# Patient Record
Sex: Male | Born: 1960 | Race: White | Hispanic: No | Marital: Married | Smoking: Never smoker
Health system: Southern US, Community
[De-identification: ages and names within clinical notes are randomized; demographics above are authoritative.]

## PROBLEM LIST (undated history)

## (undated) DIAGNOSIS — F411 Generalized anxiety disorder: Secondary | ICD-10-CM

## (undated) DIAGNOSIS — G4733 Obstructive sleep apnea (adult) (pediatric): Secondary | ICD-10-CM

## (undated) DIAGNOSIS — E669 Obesity, unspecified: Secondary | ICD-10-CM

## (undated) DIAGNOSIS — S0300XA Dislocation of jaw, unspecified side, initial encounter: Secondary | ICD-10-CM

## (undated) DIAGNOSIS — Z9989 Dependence on other enabling machines and devices: Secondary | ICD-10-CM

## (undated) DIAGNOSIS — E66811 Obesity, class 1: Secondary | ICD-10-CM

## (undated) DIAGNOSIS — E039 Hypothyroidism, unspecified: Secondary | ICD-10-CM

## (undated) HISTORY — DX: Hypothyroidism, unspecified: E03.9

## (undated) HISTORY — DX: Obesity, class 1: E66.811

## (undated) HISTORY — DX: Generalized anxiety disorder: F41.1

## (undated) HISTORY — DX: Obesity, unspecified: E66.9

## (undated) HISTORY — DX: Obstructive sleep apnea (adult) (pediatric): G47.33

## (undated) HISTORY — PX: EYE MUSCLE SURGERY: SHX370

## (undated) HISTORY — DX: Dislocation of jaw, unspecified side, initial encounter: S03.00XA

## (undated) HISTORY — DX: Dependence on other enabling machines and devices: Z99.89

---

## 1999-11-30 ENCOUNTER — Ambulatory Visit (HOSPITAL_COMMUNITY): Admission: RE | Admit: 1999-11-30 | Discharge: 1999-11-30 | Payer: Self-pay | Admitting: Urology

## 2001-02-03 ENCOUNTER — Encounter: Payer: Self-pay | Admitting: Emergency Medicine

## 2001-02-03 ENCOUNTER — Emergency Department (HOSPITAL_COMMUNITY): Admission: EM | Admit: 2001-02-03 | Discharge: 2001-02-03 | Payer: Self-pay | Admitting: Emergency Medicine

## 2004-05-03 ENCOUNTER — Ambulatory Visit: Payer: Self-pay | Admitting: Family Medicine

## 2004-05-08 ENCOUNTER — Ambulatory Visit: Payer: Self-pay | Admitting: Family Medicine

## 2005-05-02 ENCOUNTER — Ambulatory Visit: Payer: Self-pay | Admitting: Family Medicine

## 2005-05-07 ENCOUNTER — Ambulatory Visit: Payer: Self-pay | Admitting: Family Medicine

## 2005-09-18 ENCOUNTER — Ambulatory Visit: Payer: Self-pay | Admitting: Family Medicine

## 2005-10-25 ENCOUNTER — Ambulatory Visit: Payer: Self-pay | Admitting: Gastroenterology

## 2005-11-15 ENCOUNTER — Ambulatory Visit: Payer: Self-pay | Admitting: Gastroenterology

## 2005-11-18 ENCOUNTER — Ambulatory Visit: Payer: Self-pay | Admitting: Family Medicine

## 2006-03-21 ENCOUNTER — Ambulatory Visit: Payer: Self-pay | Admitting: Family Medicine

## 2010-12-05 ENCOUNTER — Encounter: Payer: Self-pay | Admitting: Gastroenterology

## 2012-05-15 DIAGNOSIS — S42309S Unspecified fracture of shaft of humerus, unspecified arm, sequela: Secondary | ICD-10-CM

## 2012-05-15 HISTORY — DX: Unspecified fracture of shaft of humerus, unspecified arm, sequela: S42.309S

## 2015-09-21 DIAGNOSIS — L820 Inflamed seborrheic keratosis: Secondary | ICD-10-CM | POA: Diagnosis not present

## 2015-09-21 DIAGNOSIS — D1801 Hemangioma of skin and subcutaneous tissue: Secondary | ICD-10-CM | POA: Diagnosis not present

## 2015-09-21 DIAGNOSIS — L814 Other melanin hyperpigmentation: Secondary | ICD-10-CM | POA: Diagnosis not present

## 2015-09-21 DIAGNOSIS — L821 Other seborrheic keratosis: Secondary | ICD-10-CM | POA: Diagnosis not present

## 2015-09-21 DIAGNOSIS — D225 Melanocytic nevi of trunk: Secondary | ICD-10-CM | POA: Diagnosis not present

## 2015-11-07 DIAGNOSIS — E039 Hypothyroidism, unspecified: Secondary | ICD-10-CM | POA: Diagnosis not present

## 2015-11-07 DIAGNOSIS — Z0000 Encounter for general adult medical examination without abnormal findings: Secondary | ICD-10-CM | POA: Diagnosis not present

## 2015-11-07 DIAGNOSIS — Z125 Encounter for screening for malignant neoplasm of prostate: Secondary | ICD-10-CM | POA: Diagnosis not present

## 2015-11-24 DIAGNOSIS — Z0000 Encounter for general adult medical examination without abnormal findings: Secondary | ICD-10-CM | POA: Diagnosis not present

## 2015-11-24 DIAGNOSIS — Z23 Encounter for immunization: Secondary | ICD-10-CM | POA: Diagnosis not present

## 2015-12-15 DIAGNOSIS — G4733 Obstructive sleep apnea (adult) (pediatric): Secondary | ICD-10-CM | POA: Diagnosis not present

## 2016-01-05 DIAGNOSIS — E039 Hypothyroidism, unspecified: Secondary | ICD-10-CM | POA: Diagnosis not present

## 2016-03-05 DIAGNOSIS — F411 Generalized anxiety disorder: Secondary | ICD-10-CM | POA: Diagnosis not present

## 2016-03-05 DIAGNOSIS — E039 Hypothyroidism, unspecified: Secondary | ICD-10-CM | POA: Diagnosis not present

## 2016-03-29 DIAGNOSIS — D3131 Benign neoplasm of right choroid: Secondary | ICD-10-CM | POA: Diagnosis not present

## 2016-03-29 DIAGNOSIS — H01001 Unspecified blepharitis right upper eyelid: Secondary | ICD-10-CM | POA: Diagnosis not present

## 2016-03-29 DIAGNOSIS — H524 Presbyopia: Secondary | ICD-10-CM | POA: Diagnosis not present

## 2016-03-29 DIAGNOSIS — H53001 Unspecified amblyopia, right eye: Secondary | ICD-10-CM | POA: Diagnosis not present

## 2016-05-07 DIAGNOSIS — F439 Reaction to severe stress, unspecified: Secondary | ICD-10-CM | POA: Diagnosis not present

## 2016-06-13 DIAGNOSIS — G4733 Obstructive sleep apnea (adult) (pediatric): Secondary | ICD-10-CM | POA: Diagnosis not present

## 2016-11-04 DIAGNOSIS — K635 Polyp of colon: Secondary | ICD-10-CM | POA: Diagnosis not present

## 2016-11-04 DIAGNOSIS — Z86010 Personal history of colonic polyps: Secondary | ICD-10-CM | POA: Diagnosis not present

## 2016-11-04 DIAGNOSIS — K648 Other hemorrhoids: Secondary | ICD-10-CM | POA: Diagnosis not present

## 2016-11-04 DIAGNOSIS — Z1211 Encounter for screening for malignant neoplasm of colon: Secondary | ICD-10-CM | POA: Diagnosis not present

## 2016-11-04 DIAGNOSIS — D125 Benign neoplasm of sigmoid colon: Secondary | ICD-10-CM | POA: Diagnosis not present

## 2016-11-04 DIAGNOSIS — Z800 Family history of malignant neoplasm of digestive organs: Secondary | ICD-10-CM | POA: Diagnosis not present

## 2016-11-04 DIAGNOSIS — D122 Benign neoplasm of ascending colon: Secondary | ICD-10-CM | POA: Diagnosis not present

## 2016-11-05 DIAGNOSIS — D225 Melanocytic nevi of trunk: Secondary | ICD-10-CM | POA: Diagnosis not present

## 2016-11-05 DIAGNOSIS — L821 Other seborrheic keratosis: Secondary | ICD-10-CM | POA: Diagnosis not present

## 2016-11-05 DIAGNOSIS — L814 Other melanin hyperpigmentation: Secondary | ICD-10-CM | POA: Diagnosis not present

## 2016-11-05 DIAGNOSIS — D2339 Other benign neoplasm of skin of other parts of face: Secondary | ICD-10-CM | POA: Diagnosis not present

## 2016-11-06 DIAGNOSIS — D125 Benign neoplasm of sigmoid colon: Secondary | ICD-10-CM | POA: Diagnosis not present

## 2016-11-06 DIAGNOSIS — D122 Benign neoplasm of ascending colon: Secondary | ICD-10-CM | POA: Diagnosis not present

## 2016-11-20 DIAGNOSIS — R5381 Other malaise: Secondary | ICD-10-CM | POA: Diagnosis not present

## 2016-11-20 DIAGNOSIS — S81019A Laceration without foreign body, unspecified knee, initial encounter: Secondary | ICD-10-CM | POA: Diagnosis not present

## 2016-11-20 DIAGNOSIS — R0789 Other chest pain: Secondary | ICD-10-CM | POA: Diagnosis not present

## 2016-12-02 DIAGNOSIS — Z0000 Encounter for general adult medical examination without abnormal findings: Secondary | ICD-10-CM | POA: Diagnosis not present

## 2016-12-02 DIAGNOSIS — Z125 Encounter for screening for malignant neoplasm of prostate: Secondary | ICD-10-CM | POA: Diagnosis not present

## 2016-12-05 DIAGNOSIS — E7800 Pure hypercholesterolemia, unspecified: Secondary | ICD-10-CM | POA: Diagnosis not present

## 2016-12-05 DIAGNOSIS — Z0001 Encounter for general adult medical examination with abnormal findings: Secondary | ICD-10-CM | POA: Diagnosis not present

## 2016-12-05 DIAGNOSIS — Z8781 Personal history of (healed) traumatic fracture: Secondary | ICD-10-CM | POA: Diagnosis not present

## 2016-12-05 DIAGNOSIS — E039 Hypothyroidism, unspecified: Secondary | ICD-10-CM | POA: Diagnosis not present

## 2017-01-21 DIAGNOSIS — R972 Elevated prostate specific antigen [PSA]: Secondary | ICD-10-CM | POA: Diagnosis not present

## 2017-03-24 DIAGNOSIS — G4733 Obstructive sleep apnea (adult) (pediatric): Secondary | ICD-10-CM | POA: Diagnosis not present

## 2017-06-25 DIAGNOSIS — G4733 Obstructive sleep apnea (adult) (pediatric): Secondary | ICD-10-CM | POA: Diagnosis not present

## 2017-10-30 DIAGNOSIS — G4733 Obstructive sleep apnea (adult) (pediatric): Secondary | ICD-10-CM | POA: Diagnosis not present

## 2017-12-02 DIAGNOSIS — D229 Melanocytic nevi, unspecified: Secondary | ICD-10-CM | POA: Diagnosis not present

## 2017-12-02 DIAGNOSIS — L578 Other skin changes due to chronic exposure to nonionizing radiation: Secondary | ICD-10-CM | POA: Diagnosis not present

## 2017-12-02 DIAGNOSIS — L821 Other seborrheic keratosis: Secondary | ICD-10-CM | POA: Diagnosis not present

## 2017-12-02 DIAGNOSIS — L814 Other melanin hyperpigmentation: Secondary | ICD-10-CM | POA: Diagnosis not present

## 2017-12-12 DIAGNOSIS — Z0000 Encounter for general adult medical examination without abnormal findings: Secondary | ICD-10-CM | POA: Diagnosis not present

## 2017-12-12 DIAGNOSIS — Z125 Encounter for screening for malignant neoplasm of prostate: Secondary | ICD-10-CM | POA: Diagnosis not present

## 2017-12-12 DIAGNOSIS — E039 Hypothyroidism, unspecified: Secondary | ICD-10-CM | POA: Diagnosis not present

## 2017-12-12 DIAGNOSIS — E7800 Pure hypercholesterolemia, unspecified: Secondary | ICD-10-CM | POA: Diagnosis not present

## 2017-12-15 DIAGNOSIS — Z23 Encounter for immunization: Secondary | ICD-10-CM | POA: Diagnosis not present

## 2017-12-15 DIAGNOSIS — Z0000 Encounter for general adult medical examination without abnormal findings: Secondary | ICD-10-CM | POA: Diagnosis not present

## 2017-12-15 DIAGNOSIS — E7800 Pure hypercholesterolemia, unspecified: Secondary | ICD-10-CM | POA: Diagnosis not present

## 2017-12-15 DIAGNOSIS — Z1212 Encounter for screening for malignant neoplasm of rectum: Secondary | ICD-10-CM | POA: Diagnosis not present

## 2018-02-13 DIAGNOSIS — E039 Hypothyroidism, unspecified: Secondary | ICD-10-CM | POA: Diagnosis not present

## 2018-02-27 DIAGNOSIS — Z23 Encounter for immunization: Secondary | ICD-10-CM | POA: Diagnosis not present

## 2018-04-03 DIAGNOSIS — G4733 Obstructive sleep apnea (adult) (pediatric): Secondary | ICD-10-CM | POA: Diagnosis not present

## 2018-04-17 DIAGNOSIS — D485 Neoplasm of uncertain behavior of skin: Secondary | ICD-10-CM | POA: Diagnosis not present

## 2018-04-24 DIAGNOSIS — H04123 Dry eye syndrome of bilateral lacrimal glands: Secondary | ICD-10-CM | POA: Diagnosis not present

## 2018-04-24 DIAGNOSIS — H524 Presbyopia: Secondary | ICD-10-CM | POA: Diagnosis not present

## 2018-04-24 DIAGNOSIS — H53001 Unspecified amblyopia, right eye: Secondary | ICD-10-CM | POA: Diagnosis not present

## 2018-04-24 DIAGNOSIS — D3131 Benign neoplasm of right choroid: Secondary | ICD-10-CM | POA: Diagnosis not present

## 2018-05-08 DIAGNOSIS — D485 Neoplasm of uncertain behavior of skin: Secondary | ICD-10-CM | POA: Diagnosis not present

## 2018-05-27 ENCOUNTER — Encounter: Payer: Self-pay | Admitting: Podiatry

## 2018-05-27 ENCOUNTER — Ambulatory Visit (INDEPENDENT_AMBULATORY_CARE_PROVIDER_SITE_OTHER): Payer: BLUE CROSS/BLUE SHIELD

## 2018-05-27 ENCOUNTER — Ambulatory Visit (INDEPENDENT_AMBULATORY_CARE_PROVIDER_SITE_OTHER): Payer: BLUE CROSS/BLUE SHIELD | Admitting: Podiatry

## 2018-05-27 ENCOUNTER — Other Ambulatory Visit: Payer: Self-pay | Admitting: Podiatry

## 2018-05-27 VITALS — BP 139/77

## 2018-05-27 DIAGNOSIS — M79672 Pain in left foot: Secondary | ICD-10-CM

## 2018-05-27 DIAGNOSIS — M722 Plantar fascial fibromatosis: Secondary | ICD-10-CM

## 2018-05-27 MED ORDER — TRIAMCINOLONE ACETONIDE 10 MG/ML IJ SUSP
10.0000 mg | Freq: Once | INTRAMUSCULAR | Status: AC
  Administered 2018-05-27: 10 mg

## 2018-05-27 NOTE — Patient Instructions (Signed)
Plantar Fasciitis      Plantar fasciitis is a painful foot condition that affects the heel. It occurs when the band of tissue that connects the toes to the heel bone (plantar fascia) becomes irritated. This can happen as the result of exercising too much or doing other repetitive activities (overuse injury).  The pain from plantar fasciitis can range from mild irritation to severe pain that makes it difficult to walk or move. The pain is usually worse in the morning after sleeping, or after sitting or lying down for a while. Pain may also be worse after long periods of walking or standing.  What are the causes?  This condition may be caused by:  Standing for long periods of time.  Wearing shoes that do not have good arch support.  Doing activities that put stress on joints (high-impact activities), including running, aerobics, and ballet.  Being overweight.  An abnormal way of walking (gait).  Tight muscles in the back of your lower leg (calf).  High arches in your feet.  Starting a new athletic activity.  What are the signs or symptoms?  The main symptom of this condition is heel pain. Pain may:  Be worse with first steps after a time of rest, especially in the morning after sleeping or after you have been sitting or lying down for a while.  Be worse after long periods of standing still.  Decrease after 30-45 minutes of activity, such as gentle walking.  How is this diagnosed?  This condition may be diagnosed based on your medical history and your symptoms. Your health care provider may ask questions about your activity level. Your health care provider will do a physical exam to check for:  A tender area on the bottom of your foot.  A high arch in your foot.  Pain when you move your foot.  Difficulty moving your foot.  You may have imaging tests to confirm the diagnosis, such as:  X-rays.  Ultrasound.  MRI.  How is this treated?  Treatment for plantar fasciitis depends on how severe your condition is.  Treatment may include:  Rest, ice, applying pressure (compression), and raising the affected foot (elevation). This may be called RICE therapy. Your health care provider may recommend RICE therapy along with over-the-counter pain medicines to manage your pain.  Exercises to stretch your calves and your plantar fascia.  A splint that holds your foot in a stretched, upward position while you sleep (night splint).  Physical therapy to relieve symptoms and prevent problems in the future.  Injections of steroid medicine (cortisone) to relieve pain and inflammation.  Stimulating your plantar fascia with electrical impulses (extracorporeal shock wave therapy). This is usually the last treatment option before surgery.  Surgery, if other treatments have not worked after 12 months.  Follow these instructions at home:      Managing pain, stiffness, and swelling  If directed, put ice on the painful area:  Put ice in a plastic bag, or use a frozen bottle of water.  Place a towel between your skin and the bag or bottle.  Roll the bottom of your foot over the bag or bottle.  Do this for 20 minutes, 2-3 times a day.  Wear athletic shoes that have air-sole or gel-sole cushions, or try wearing soft shoe inserts that are designed for plantar fasciitis.  Raise (elevate) your foot above the level of your heart while you are sitting or lying down.  Activity  Avoid activities that cause pain.   Ask your health care provider what activities are safe for you.  Do physical therapy exercises and stretches as told by your health care provider.  Try activities and forms of exercise that are easier on your joints (low-impact). Examples include swimming, water aerobics, and biking.  General instructions  Take over-the-counter and prescription medicines only as told by your health care provider.  Wear a night splint while sleeping, if told by your health care provider. Loosen the splint if your toes tingle, become numb, or turn cold and  blue.  Maintain a healthy weight, or work with your health care provider to lose weight as needed.  Keep all follow-up visits as told by your health care provider. This is important.  Contact a health care provider if you:  Have symptoms that do not go away after caring for yourself at home.  Have pain that gets worse.  Have pain that affects your ability to move or do your daily activities.  Summary  Plantar fasciitis is a painful foot condition that affects the heel. It occurs when the band of tissue that connects the toes to the heel bone (plantar fascia) becomes irritated.  The main symptom of this condition is heel pain that may be worse after exercising too much or standing still for a long time.  Treatment varies, but it usually starts with rest, ice, compression, and elevation (RICE therapy) and over-the-counter medicines to manage pain.  This information is not intended to replace advice given to you by your health care provider. Make sure you discuss any questions you have with your health care provider.  Document Released: 12/04/2000 Document Revised: 01/06/2017 Document Reviewed: 01/06/2017  Elsevier Interactive Patient Education © 2019 [employer]

## 2018-05-27 NOTE — Progress Notes (Signed)
Subjective:   Patient ID: Jay Patton, male   DOB: 58 y.o.   MRN: [id number]   HPI Patient states the heel has been sore for the last few weeks and he was wearing different shoes starting November December and is actually been bothering him some since then.  Patient does not smoke and likes to be active   Review of Systems  All other systems reviewed and are negative.       Objective:  Physical Exam Vitals signs and nursing note reviewed.  Constitutional:      Appearance: He is well-developed.  Pulmonary:     Effort: Pulmonary effort is normal.  Musculoskeletal: Normal range of motion.  Skin:    General: Skin is warm.  Neurological:     Mental Status: He is alert.     Neurovascular status intact muscle strength is adequate range of motion within normal limits with patient found to have an area of exquisite discomfort plantar aspect left heel and is also noted to have a high arch foot structure.  Patient is noted to have good digital perfusion is well oriented x3     Assessment:  Cavus foot structure left with inflammation of the medial band of the fascia occurring     Plan:  H&P condition reviewed and I went ahead today and I injected the left plantar fascial 3 mg Kenalog 5 mg Xylocaine and advised on physical therapy supportive shoes not going barefoot and that it may require other treatment depending on symptoms  X-ray indicates a high arch foot structure with small spur formation with no indication of stress fracture arthritis

## 2018-07-27 DIAGNOSIS — G4733 Obstructive sleep apnea (adult) (pediatric): Secondary | ICD-10-CM | POA: Diagnosis not present

## 2018-11-09 DIAGNOSIS — G4733 Obstructive sleep apnea (adult) (pediatric): Secondary | ICD-10-CM | POA: Diagnosis not present

## 2018-12-17 DIAGNOSIS — Z0000 Encounter for general adult medical examination without abnormal findings: Secondary | ICD-10-CM | POA: Diagnosis not present

## 2018-12-30 DIAGNOSIS — E7800 Pure hypercholesterolemia, unspecified: Secondary | ICD-10-CM | POA: Diagnosis not present

## 2018-12-30 DIAGNOSIS — Z9989 Dependence on other enabling machines and devices: Secondary | ICD-10-CM | POA: Diagnosis not present

## 2018-12-30 DIAGNOSIS — E039 Hypothyroidism, unspecified: Secondary | ICD-10-CM | POA: Diagnosis not present

## 2018-12-30 DIAGNOSIS — Z23 Encounter for immunization: Secondary | ICD-10-CM | POA: Diagnosis not present

## 2018-12-30 DIAGNOSIS — Z0000 Encounter for general adult medical examination without abnormal findings: Secondary | ICD-10-CM | POA: Diagnosis not present

## 2019-02-12 DIAGNOSIS — E039 Hypothyroidism, unspecified: Secondary | ICD-10-CM | POA: Diagnosis not present

## 2019-02-20 DIAGNOSIS — S0502XA Injury of conjunctiva and corneal abrasion without foreign body, left eye, initial encounter: Secondary | ICD-10-CM | POA: Diagnosis not present

## 2019-03-05 DIAGNOSIS — S81011A Laceration without foreign body, right knee, initial encounter: Secondary | ICD-10-CM | POA: Diagnosis not present

## 2019-03-24 DIAGNOSIS — G4733 Obstructive sleep apnea (adult) (pediatric): Secondary | ICD-10-CM | POA: Diagnosis not present

## 2019-04-05 DIAGNOSIS — Z03818 Encounter for observation for suspected exposure to other biological agents ruled out: Secondary | ICD-10-CM | POA: Diagnosis not present

## 2019-04-05 DIAGNOSIS — Z20828 Contact with and (suspected) exposure to other viral communicable diseases: Secondary | ICD-10-CM | POA: Diagnosis not present

## 2019-07-14 DIAGNOSIS — E7800 Pure hypercholesterolemia, unspecified: Secondary | ICD-10-CM | POA: Diagnosis not present

## 2019-07-14 DIAGNOSIS — Z125 Encounter for screening for malignant neoplasm of prostate: Secondary | ICD-10-CM | POA: Diagnosis not present

## 2019-07-14 DIAGNOSIS — E039 Hypothyroidism, unspecified: Secondary | ICD-10-CM | POA: Diagnosis not present

## 2019-07-20 DIAGNOSIS — G4733 Obstructive sleep apnea (adult) (pediatric): Secondary | ICD-10-CM | POA: Diagnosis not present

## 2019-10-07 DIAGNOSIS — D2272 Melanocytic nevi of left lower limb, including hip: Secondary | ICD-10-CM | POA: Diagnosis not present

## 2019-10-07 DIAGNOSIS — D225 Melanocytic nevi of trunk: Secondary | ICD-10-CM | POA: Diagnosis not present

## 2019-10-07 DIAGNOSIS — L814 Other melanin hyperpigmentation: Secondary | ICD-10-CM | POA: Diagnosis not present

## 2019-10-07 DIAGNOSIS — D485 Neoplasm of uncertain behavior of skin: Secondary | ICD-10-CM | POA: Diagnosis not present

## 2019-10-07 DIAGNOSIS — L308 Other specified dermatitis: Secondary | ICD-10-CM | POA: Diagnosis not present

## 2019-10-21 DIAGNOSIS — G4733 Obstructive sleep apnea (adult) (pediatric): Secondary | ICD-10-CM | POA: Diagnosis not present

## 2019-11-29 DIAGNOSIS — H2000 Unspecified acute and subacute iridocyclitis: Secondary | ICD-10-CM | POA: Diagnosis not present

## 2019-11-29 DIAGNOSIS — S0502XA Injury of conjunctiva and corneal abrasion without foreign body, left eye, initial encounter: Secondary | ICD-10-CM | POA: Diagnosis not present

## 2019-12-01 DIAGNOSIS — S0502XD Injury of conjunctiva and corneal abrasion without foreign body, left eye, subsequent encounter: Secondary | ICD-10-CM | POA: Diagnosis not present

## 2019-12-20 ENCOUNTER — Emergency Department (HOSPITAL_COMMUNITY)
Admission: EM | Admit: 2019-12-20 | Discharge: 2019-12-20 | Disposition: A | Discharge status: Home / Self Care | Payer: BC Managed Care – PPO | Attending: Emergency Medicine | Admitting: Emergency Medicine

## 2019-12-20 ENCOUNTER — Emergency Department (HOSPITAL_COMMUNITY): Payer: BC Managed Care – PPO

## 2019-12-20 DIAGNOSIS — R079 Chest pain, unspecified: Secondary | ICD-10-CM | POA: Diagnosis not present

## 2019-12-20 DIAGNOSIS — R0789 Other chest pain: Secondary | ICD-10-CM | POA: Diagnosis not present

## 2019-12-20 LAB — BASIC METABOLIC PANEL
Anion gap: 10 (ref 5–15)
BUN: 13 mg/dL (ref 6–20)
CO2: 27 mmol/L (ref 22–32)
Calcium: 9.4 mg/dL (ref 8.9–10.3)
Chloride: 101 mmol/L (ref 98–111)
Creatinine, Ser: 0.96 mg/dL (ref 0.61–1.24)
GFR calc Af Amer: >60 mL/min (ref >60)
GFR calc non Af Amer: >60 mL/min (ref >60)
Glucose, Bld: 108 mg/dL — ABNORMAL HIGH (ref 70–99)
Potassium: 4 mmol/L (ref 3.5–5.1)
Sodium: 138 mmol/L (ref 135–145)

## 2019-12-20 LAB — CBC
HCT: 42.5 % (ref 39.0–52.0)
Hemoglobin: 13.8 g/dL (ref 13.0–17.0)
MCH: 29.7 pg (ref 26.0–34.0)
MCHC: 32.5 g/dL (ref 30.0–36.0)
MCV: 91.4 fL (ref 80.0–100.0)
Platelets: 215 10*3/uL (ref 150–400)
RBC: 4.65 MIL/uL (ref 4.22–5.81)
RDW: 12.9 % (ref 11.5–15.5)
WBC: 6.5 10*3/uL (ref 4.0–10.5)
nRBC: 0 % (ref 0.0–0.2)

## 2019-12-20 LAB — TROPONIN I (HIGH SENSITIVITY)
Troponin I (High Sensitivity): 3 ng/L (ref <18)
Troponin I (High Sensitivity): 4 ng/L (ref <18)

## 2019-12-20 NOTE — Discharge Instructions (Signed)
Follow-up with cardiology or your primary care doctor to have a stress test arranged/further cardiac work-up.  If pain comes back as discussed or symptoms worsen please return

## 2019-12-20 NOTE — ED Triage Notes (Signed)
Pt here from home with c/o right side chest pain , pain haas been on the left side for 2 weeks prior to today , no n/v or sob

## 2019-12-20 NOTE — ED Provider Notes (Addendum)
[facility] EMERGENCY DEPARTMENT Provider Note   CSN: [id number] Arrival date & time: 12/20/19  0957     History Chief Complaint  Patient presents with  . Chest Pain    Jay Patton is a 59 y.o. male.  The history is provided by the patient.  Chest Pain Pain location:  R chest Pain quality: sharp   Pain radiates to:  Does not radiate Pain severity:  Mild Onset quality:  Sudden Duration:  45 minutes Progression:  Resolved Chronicity:  New Context: at rest   Relieved by:  Nothing Worsened by:  Nothing Associated symptoms: no abdominal pain, no back pain, no cough, no fever, no palpitations, no shortness of breath and no vomiting   Risk factors: high cholesterol   Risk factors: no coronary artery disease, no diabetes mellitus, no hypertension, no prior DVT/PE and no smoking        No past medical history on file.  There are no problems to display for this patient.   No past surgical history on file.     No family history on file.  Social History   Tobacco Use  . Smoking status: Never Smoker  . Smokeless tobacco: Never Used  Substance Use Topics  . Alcohol use: Not on file  . Drug use: Not on file    Home Medications Prior to Admission medications   Medication Sig Start Date End Date Taking? Authorizing Provider  atorvastatin (LIPITOR) 10 MG tablet  05/21/18   [provider]  mupirocin ointment (BACTROBAN) 2 %  04/17/18   [provider]  sertraline (ZOLOFT) 100 MG tablet Take 100 mg by mouth daily. 05/07/18   [provider]  SYNTHROID 75 MCG tablet  04/05/18   [provider]    Allergies    Sulfa antibiotics  Review of Systems   Review of Systems  Constitutional: Negative for chills and fever.  HENT: Negative for ear pain and sore throat.   Eyes: Negative for pain and visual disturbance.  Respiratory: Negative for cough and shortness of breath.   Cardiovascular: Positive for chest pain.  Negative for palpitations.  Gastrointestinal: Negative for abdominal pain and vomiting.  Genitourinary: Negative for dysuria and hematuria.  Musculoskeletal: Negative for arthralgias and back pain.  Skin: Negative for color change and rash.  Neurological: Negative for seizures and syncope.  All other systems reviewed and are negative.   Physical Exam Updated Vital Signs  ED Triage Vitals [12/20/19 1024]  Enc Vitals Group     BP (!) 150/91     Pulse Rate 65     Resp 10     Temp 98.6 F (37 C)     Temp Source Oral     SpO2 97 %     Weight      Height      Head Circumference      Peak Flow      Pain Score      Pain Loc      Pain Edu?      Excl. in GC?     Physical Exam Vitals and nursing note reviewed.  Constitutional:      General: He is not in acute distress.    Appearance: He is well-developed. He is not ill-appearing.  HENT:     Head: Normocephalic and atraumatic.  Eyes:     Conjunctiva/sclera: Conjunctivae normal.     Pupils: Pupils are equal, round, and reactive to light.  Cardiovascular:  Rate and Rhythm: Normal rate and regular rhythm.     Pulses:          Radial pulses are 2+ on the right side and 2+ on the left side.     Heart sounds: No murmur heard.   Pulmonary:     Effort: Pulmonary effort is normal. No respiratory distress.     Breath sounds: Normal breath sounds. No decreased breath sounds, wheezing or rhonchi.  Abdominal:     Palpations: Abdomen is soft.     Tenderness: There is no abdominal tenderness.  Musculoskeletal:        General: Normal range of motion.     Cervical back: Normal range of motion and neck supple.     Right lower leg: No edema.     Left lower leg: No edema.  Skin:    General: Skin is warm and dry.     Capillary Refill: Capillary refill takes less than 2 seconds.  Neurological:     General: No focal deficit present.     Mental Status: He is alert.     ED Results / Procedures / Treatments   Labs (all labs ordered  are listed, but only abnormal results are displayed) Labs Reviewed  BASIC METABOLIC PANEL - Abnormal; Notable for the following components:      Result Value   Glucose, Bld 108 (*)    All other components within normal limits  CBC  TROPONIN I (HIGH SENSITIVITY)  TROPONIN I (HIGH SENSITIVITY)    EKG EKG Interpretation  Date/Time:  Monday December 20 2019 10:21:12 EDT Ventricular Rate:  65 PR Interval:  166 QRS Duration: 86 QT Interval:  390 QTC Calculation: 405 R Axis:   65 Text Interpretation: Normal sinus rhythm Normal ECG Confirmed by Virgina Norfolk [id number]) on 12/20/2019 1:23:32 PM   Radiology DG Chest 2 View  Result Date: 12/20/2019 CLINICAL DATA:  Chest pain EXAM: CHEST - 2 VIEW COMPARISON:  None FINDINGS: Normal heart size, mediastinal contours, and pulmonary vascularity. Mild peribronchial thickening. No pulmonary infiltrate, pleural effusion, or pneumothorax. Bones unremarkable. IMPRESSION: Bronchitic changes without infiltrate. Electronically Signed   By: Ulyses Southward M.D.   On: 12/20/2019 11:14    Procedures Procedures (including critical care time)  Medications Ordered in ED Medications - No data to display  ED Course  I have reviewed the triage vital signs and the nursing notes.  Pertinent labs & imaging results that were available during my care of the patient were reviewed by me and considered in my medical decision making (see chart for details).    MDM Rules/Calculators/A&P                          Jay Patton is a 59 year old male with history of high cholesterol who presents to the ED with chest pain.  Normal vitals.  No fever.  No current chest pain.  Has had some intermittent sharp chest pain.  Had some this morning on the right side of his chest.  Not worse with exertion, no diaphoresis, no radiation of the pain.  He states it feels like indigestion.  He does have a family history of cardiac disease at a young age.  He does not have any shortness of  breath.  He has no PE or DVT risk factors.  Wells criteria 0 doubt PE.  EKG shows sinus rhythm.  No ischemic changes.  Chest x-ray shows no signs of pneumonia, no pneumothorax, no pleural  effusion.  There are some bronchitis changes and he has had mild cough recently.  Could have some pleurisy.  Pain is not reproducible.  Troponin is normal.  EKG unremarkable.  Heart score is 2.  Will check second troponin and is dissipate follow-up with cardiology/PCP for further cardiac work-up.   Prior to repeat troponin result and patient wanted to leave AMA.  Recommend that he follow-up with his troponin on his my chart and to return if troponin is elevated.  Understands to follow-up with cardiology/PCP.    Final Clinical Impression(s) / ED Diagnoses Final diagnoses:  Nonspecific chest pain    Rx / DC Orders ED Discharge Orders    None       Virgina Norfolk, DO 12/20/19 1436    Virgina Norfolk, DO 12/20/19 1517

## 2019-12-20 NOTE — ED Notes (Signed)
Reviewed discharge instructions with patient. Follow-up care reviewed. Patient verbalized understanding. Patient A&Ox4, VSS, and ambulatory with steady gait upon discharge.  

## 2019-12-29 DIAGNOSIS — E7800 Pure hypercholesterolemia, unspecified: Secondary | ICD-10-CM | POA: Diagnosis not present

## 2019-12-29 DIAGNOSIS — Z0000 Encounter for general adult medical examination without abnormal findings: Secondary | ICD-10-CM | POA: Diagnosis not present

## 2019-12-29 DIAGNOSIS — Z125 Encounter for screening for malignant neoplasm of prostate: Secondary | ICD-10-CM | POA: Diagnosis not present

## 2019-12-29 DIAGNOSIS — E039 Hypothyroidism, unspecified: Secondary | ICD-10-CM | POA: Diagnosis not present

## 2020-01-10 DIAGNOSIS — Z23 Encounter for immunization: Secondary | ICD-10-CM | POA: Diagnosis not present

## 2020-01-10 DIAGNOSIS — R079 Chest pain, unspecified: Secondary | ICD-10-CM | POA: Diagnosis not present

## 2020-01-10 DIAGNOSIS — E7800 Pure hypercholesterolemia, unspecified: Secondary | ICD-10-CM | POA: Diagnosis not present

## 2020-01-10 DIAGNOSIS — E039 Hypothyroidism, unspecified: Secondary | ICD-10-CM | POA: Diagnosis not present

## 2020-01-10 DIAGNOSIS — Z0000 Encounter for general adult medical examination without abnormal findings: Secondary | ICD-10-CM | POA: Diagnosis not present

## 2020-01-17 ENCOUNTER — Ambulatory Visit (INDEPENDENT_AMBULATORY_CARE_PROVIDER_SITE_OTHER): Payer: BC Managed Care – PPO | Admitting: Plastic Surgery

## 2020-01-17 ENCOUNTER — Other Ambulatory Visit: Payer: Self-pay

## 2020-01-17 ENCOUNTER — Encounter: Payer: Self-pay | Admitting: Plastic Surgery

## 2020-01-17 VITALS — BP 117/85 | HR 79 | Temp 98.4°F | Ht 73.0 in | Wt 225.0 lb

## 2020-01-17 DIAGNOSIS — L989 Disorder of the skin and subcutaneous tissue, unspecified: Secondary | ICD-10-CM

## 2020-01-17 NOTE — Progress Notes (Signed)
   Referring Provider Merri Brunette, MD [address] [place],  [place] [address]   CC: No chief complaint on file.     Jay Patton is an 59 y.o. male.  HPI: Patient presents to discuss several subcutaneous masses that are bothering him.  He has one in his right knee that bothers him when he is kneeling down.  There is one on his left long finger on the volar surface that bothers him when gripping objects.  He has another one on his posterior scalp that is painful.  It all been there for at least 5 years and are growing in size.  He would like to have them removed.  Allergies  Allergen Reactions  . Sulfa Antibiotics Hives    Outpatient Encounter Medications as of 01/17/2020  Medication Sig  . atorvastatin (LIPITOR) 10 MG tablet Take 10 mg by mouth daily.   . mupirocin ointment (BACTROBAN) 2 %  (Patient not taking: Reported on 12/20/2019)  . sertraline (ZOLOFT) 100 MG tablet Take 100 mg by mouth daily.  Marland Kitchen SYNTHROID 200 MCG tablet Take 200 mcg by mouth daily.   No facility-administered encounter medications on file as of 01/17/2020.     No past medical history on file.  No past surgical history on file.  No family history on file.  Social History   Social History Narrative  . Not on file     Review of Systems General: Denies fevers, chills, weight loss CV: Denies chest pain, shortness of breath, palpitations  Physical Exam Vitals with BMI 01/17/2020 12/20/2019 12/20/2019  Height 6\' 1"  - -  Weight 225 lbs - -  BMI 29.69 - -  Systolic 117 132  Diastolic 85 85 67  Pulse 79 65 62    General:  No acute distress,  Alert and oriented, Non-Toxic, Normal speech and affect Examination of the right knee shows a 3 to 4 cm subcutaneous cystic lesion that is freely mobile.  Examination of the left long finger shows a 1 to 1.5 cm volar cystic lesion just distal to the PIP joint with no overlying skin changes.  The finger is neurovascularly intact with normal  sensation and normal range of motion.  Examination posterior scalp shows a 2.5 cm subcutaneous mobile mass with no overlying skin changes.  Assessment/Plan Patient presents with multiple cystic lesions that are symptomatic and warrant removal.  We discussed excision in the office under local.  We discussed the risks include bleeding, infection, damage to surrounding structures and need for additional procedures.  All of his questions were answered and we will plan to proceed.  060 01/17/2020, 10:11 AM

## 2020-01-18 ENCOUNTER — Encounter: Payer: Self-pay | Admitting: Cardiology

## 2020-01-18 ENCOUNTER — Ambulatory Visit (INDEPENDENT_AMBULATORY_CARE_PROVIDER_SITE_OTHER): Payer: BC Managed Care – PPO | Admitting: Cardiology

## 2020-01-18 DIAGNOSIS — E782 Mixed hyperlipidemia: Secondary | ICD-10-CM

## 2020-01-18 DIAGNOSIS — E785 Hyperlipidemia, unspecified: Secondary | ICD-10-CM | POA: Insufficient documentation

## 2020-01-18 DIAGNOSIS — R0789 Other chest pain: Secondary | ICD-10-CM

## 2020-01-18 DIAGNOSIS — R079 Chest pain, unspecified: Secondary | ICD-10-CM | POA: Diagnosis not present

## 2020-01-18 DIAGNOSIS — Z8249 Family history of ischemic heart disease and other diseases of the circulatory system: Secondary | ICD-10-CM

## 2020-01-18 NOTE — Progress Notes (Signed)
Primary Care Provider: Merri Brunette, MD Cardiologist: No primary care provider on file. Electrophysiologist: None  Clinic Note: Chief Complaint  Patient presents with  . Hospitalization Follow-up    ER visit follow-up-for chest pain.    HPI:    Jay Patton is a 59 y.o. male with a PMH notable for HYPERLIPIDEMIA and HYPOTHYROIDISM who is being seen today for the evaluation of CHEST PAIN at the request of Jay Brunette, MD -> s/p ER visit (was not seen by PCP).  Problem List Items Addressed This Visit    Atypical chest pain   Relevant Orders   MYOCARDIAL PERFUSION IMAGING   Hyperlipidemia   Family history of coronary artery disease in mother   Relevant Orders   MYOCARDIAL PERFUSION IMAGING     Recent Hospitalizations:   12/20/2019: ER visit for chest pain. Was noted to be hypertensive with pressures in the 150/90s. 2 - troponin levels. Discharged for cardiology evaluation he decided to leave AMA prior to getting the second troponin level back.. Both troponin levels were negative.  [name] was deferred based on telehealth call after this ER visit.  Reviewed  CV studies:    The following studies were reviewed today: (if available, images/films reviewed: From Epic Chart or Care Everywhere) . Not checked:   Interval History:   Jay Patton presents today at the request of Dr.Pharr in response to his recent ER visit.  He said that he has had a couple spells of sharp off-and-on discomfort Under the breast on the left side.  Not associated shortness of breath and now he describes as pressure. When he went to the ER he felt like he had that symptom that came on with walking.  Off-and-on aching feeling to go down the arm.  This episode initially happened in the morning.  General episode later that day but was able to be treated in the outpatient setting.  He is not having any recurrent chest pain, really has not done much here in the hospital.  Other than the  spells, he is doing relatively well from a cardiac standpoint.  CV Review of Symptoms (Summary) positive for - chest pain and Dizziness, shortness of breath negative for - edema, irregular heartbeat, palpitations or rapid heart rate  The patient does not have symptoms concerning for COVID-19 infection (fever, chills, cough, or new shortness of breath).   REVIEWED OF SYSTEMS   Review of Systems  Respiratory: Positive for shortness of breath.   Cardiovascular: Negative for claudication (May be mild).  Gastrointestinal: Negative for abdominal pain, blood in stool and melena.  Genitourinary: Negative for hematuria.  Musculoskeletal: Positive for joint pain (His knee bilateral knee pain.  Limits his walking.).  Neurological: Negative for dizziness, focal weakness, weakness and headaches.  Psychiatric/Behavioral: The patient is nervous/anxious.    I have reviewed and (if needed) personally updated the patient's problem list, medications, allergies, past medical and surgical history, social and family history.   PAST MEDICAL HISTORY   Past Medical History:  Diagnosis Date  . Arm fracture, late effect 05/15/2012  . GAD (generalized anxiety disorder)   . Hypothyroidism    Taking levothyroxine/Synthroid 275 mcg  . Obesity (BMI 30.0-34.9)   . OSA on CPAP   . TMJ (dislocation of temporomandibular joint)     PAST SURGICAL HISTORY   Past Surgical History:  Procedure Laterality Date  . EYE MUSCLE SURGERY     Surgery for amblyopia     There is no immunization history  on file for this patient.  MEDICATIONS/ALLERGIES   Current Meds  Medication Sig  . atorvastatin (LIPITOR) 10 MG tablet Take 10 mg by mouth daily.   Marland Kitchen. levothyroxine (SYNTHROID) 75 MCG tablet Take 75 mcg by mouth daily.  . sertraline (ZOLOFT) 100 MG tablet Take 100 mg by mouth daily.  Marland Kitchen. SYNTHROID 200 MCG tablet Take 200 mcg by mouth daily.    Allergies  Allergen Reactions  . Sulfa Antibiotics Hives    SOCIAL  HISTORY/FAMILY HISTORY   Reviewed in Epic:  Pertinent findings:  Social History   Tobacco Use  . Smoking status: Never Smoker  . Smokeless tobacco: Never Used  Substance Use Topics  . Alcohol use: Yes    Alcohol/week: 2.0 standard drinks    Types: 2 Standard drinks or equivalent per week    Comment: Monitor drinks a week  . Drug use: Never   Social History   Social History Narrative   Exercises quite a bit during work, no routine exercise.   Family History  Problem Relation Age of Onset  . Heart disease Mother        He does not know details  . Stroke Father   . Dementia Father     OBJCTIVE -PE, EKG, labs   Wt Readings from Last 3 Encounters:  01/18/20 227 lb 12.8 oz (103.3 kg)  01/17/20 225 lb (102.1 kg)    Physical Exam: BP 138/84   Pulse 62   Ht 6\' 1"  (1.854 m)   Wt 227 lb 12.8 oz (103.3 kg)   BMI 30.05 kg/m  Physical Exam Constitutional:      Appearance: Normal appearance. He is obese. He is not toxic-appearing.  HENT:     Head: Normocephalic and atraumatic.  Eyes:     Extraocular Movements: Extraocular movements intact.     Pupils: Pupils are equal, round, and reactive to light.  Cardiovascular:     Rate and Rhythm: Normal rate and regular rhythm.     Pulses: Normal pulses.     Heart sounds: Normal heart sounds. No murmur heard.  No friction rub. No gallop.   Pulmonary:     Effort: Pulmonary effort is normal. No respiratory distress.     Breath sounds: Normal breath sounds. No wheezing, rhonchi or rales.  Chest:     Chest wall: No tenderness.  Abdominal:     General: Bowel sounds are normal. There is no distension.     Palpations: Abdomen is soft. There is no mass.  Musculoskeletal:        General: No swelling. Normal range of motion.     Cervical back: Normal range of motion and neck supple. No rigidity.  Neurological:     General: No focal deficit present.     Mental Status: He is alert and oriented to person, place, and time.     Cranial  Nerves: No cranial nerve deficit.  Psychiatric:        Mood and Affect: Mood normal.        Behavior: Behavior normal.        Thought Content: Thought content normal.        Judgment: Judgment normal.     Adult ECG Report Not done  Recent Labs: Stable No results found for: CHOL, HDL, LDLCALC, LDLDIRECT, TRIG, CHOLHDL Lab Results  Component Value Date   CREATININE 0.96 12/20/2019   BUN 13 12/20/2019   NA 138 12/20/2019   K 4.0 12/20/2019   CL 101 12/20/2019   CO2  27 12/20/2019   No results found for: TSH  ASSESSMENT/PLAN   Jay Patton is here with episodes of chest pain that seems somewhat atypical from what they are occasionally exertional.  He does have a history for heart disease but he is not certain the details.   After prolonged discussion, we decided to proceed with ischemic evaluation.  We talked initially about a GXT which would potentially see her some direction of coronary CTA versus nuclear stress test.  He failed to mention initially that he would not tolerate beta-blocker treadmill because of his knees.  Therefore decided to change over to [employer].  Problem List Items Addressed This Visit    Atypical chest pain   Relevant Orders   MYOCARDIAL PERFUSION IMAGING   Hyperlipidemia   Family history of coronary artery disease in mother   Relevant Orders   MYOCARDIAL PERFUSION IMAGING    Other Visit Diagnoses    Chest pain, unspecified type       Relevant Orders   MYOCARDIAL PERFUSION IMAGING       COVID-19 Education: The signs and symptoms of COVID-19 were discussed with the patient and how to seek care for testing (follow up with PCP or arrange E-visit).   The importance of social distancing and COVID-19 vaccination was discussed today. 2 min The patient is practicing social distancing & Masking.   I spent a total of 24 minutes with the patient spent in direct patient consultation.  Additional spent reviewing chart and updating records Total  Time:   Current medicines are reviewed at length with the patient today.  (+/- concerns) no change  This visit occurred during the SARS-CoV-2 public health emergency.  Safety protocols were in place, including screening questions prior to the visit, additional usage of staff PPE, and extensive cleaning of exam room while observing appropriate contact time as indicated for disinfecting solutions.  Notice: This dictation was prepared with Dragon dictation along with smaller phrase technology. Any transcriptional errors that result from this process are unintentional and may not be corrected upon review.  Patient Instructions / Medication Changes & Studies & Tests Ordered   Patient Instructions  Medication Instructions:  Not needed *If you need a refill on your cardiac medications before your next appointment, please call your pharmacy*   Lab Work: Will need to have a Covid test 3 days prior to exercise toleracne  Test at [address]  [facility] - in [place]  Then self quarantine   If you have labs (blood work) drawn today and your tests are completely normal, you will receive your results only by: Marland Kitchen MyChart Message (if you have MyChart) OR . A paper copy in the mail If you have any lab test that is abnormal or we need to change your treatment, we will call you to review the results.   Testing/Procedures: Will be schedule at [address] Your physician has requested that you have an exercise tolerance test. Please also follow instruction sheet, as given.   Exercise Stress Test An exercise stress test is a test to check how your heart works during exercise. You will need to walk on a treadmill or ride an exercise bike for this test. An electrocardiogram (ECG) will record your heartbeat when you are at rest and when you are exercising. You may have an ultrasound or nuclear test after the exercise test. The test is done to check for coronary artery disease  (CAD). It is also done to:  See how well you can exercise.  Watch for high blood pressure during exercise.  Test how well you can exercise after treatment.  Check the blood flow to your arms and legs. If your test result is not normal, more testing may be needed. What happens before the procedure?  Follow instructions from your doctor about what you cannot eat or drink. ? Do not have any drinks or foods that have caffeine in them for 24 hours before the test, or as told by your doctor. This includes coffee, tea (even decaf tea), sodas, chocolate, and cocoa.  Ask your doctor about changing or stopping your normal medicines. This is important if you: ? Take diabetes medicines. ? Take beta-blocker medicines. ? Wear a nitroglycerin patch.  If you use an inhaler, bring it with you to the test.  Do not put lotions, powders, creams, or oils on your chest before the test.  Wear comfortable shoes and clothing.  Do not use any products that have nicotine or tobacco in them, such as cigarettes and e-cigarettes. Stop using them at least 4 hours before the test. If you need help quitting, ask your doctor. What happens during the procedure?   Patches (electrodes) will be put on your chest.  Wires will be connected to the patches. The wires will send signals to a machine to record your heartbeat.  Your heart rate will be watched while you are resting and while you are exercising. Your blood pressure will also be watched during the test.  You will walk on a treadmill or use a stationary bike. If you cannot use these, you may be asked to turn a crank with your hands.  The activity will get harder and will raise your heart rate.  You may be asked to breathe into a tube a few times during the test. This measures the gases that you breathe out.  You will be asked how you are feeling throughout the test.  You will exercise until your heart reaches a target heart rate. You will stop early  if: ? You feel dizzy. ? You have chest pain. ? You are out of breath. ? Your blood pressure is too high or too low. ? You have an irregular heartbeat. ? You have pain or aching in your arms or legs. The procedure may vary among doctors and hospitals. What happens after the procedure?  Your blood pressure, heart rate, breathing rate, and blood oxygen level will be watched after the test.  You may return to your normal diet and activities as told by your doctor.  It is up to you to get the results of your test. Ask your doctor, or the department that is doing the test, when your results will be ready. Summary  An exercise stress test is a test to check how your heart works during exercise.  This test is done to check for coronary artery disease.  Your heart rate will be watched while you are resting and while you are exercising.  Follow instructions from your doctor about what you cannot eat or drink before the test. This information is not intended to replace advice given to you by your health care provider. Make sure you discuss any questions you have with your health care provider. Document Revised: 06/23/2018 Document Reviewed: 06/11/2016 Elsevier Patient Education  2020 [employer].    Follow-Up: At [facility], you and your health needs are our priority.  As part of our continuing mission to provide you with exceptional  heart care, we have created designated Provider Care Teams.  These Care Teams include your primary Cardiologist (physician) and Advanced Practice Providers (APPs -  Physician Assistants and Nurse Practitioners) who all work together to provide you with the care you need, when you need it.  We recommend signing up for the patient portal called "MyChart".  Sign up information is provided on this After Visit Summary.  MyChart is used to connect with patients for Virtual Visits (Telemedicine).  Patients are able to view lab/test results, encounter notes, upcoming  appointments, etc.  Non-urgent messages can be sent to your provider as well.   To learn more about what you can do with MyChart, go to [email or web address].    Your next appointment:   1 month(s)  The format for your next appointment:    virtual or in person   Provider:   Eldar Lemma, MD    Studies Ordered:   Orders Placed This Encounter  Procedures  . MYOCARDIAL PERFUSION IMAGING     Maico Lemma, M.D., M.S. Interventional Cardiologist   Pager # [phone] Phone # [phone] [address] [place], [place] [address]   Thank you for choosing Heart[facility] at [facility]!!

## 2020-01-18 NOTE — Patient Instructions (Signed)
Medication Instructions:  Not needed *If you need a refill on your cardiac medications before your next appointment, please call your pharmacy*   Lab Work: Will need to have a Covid test 3 days prior to exercise toleracne  Test at [facility] - in [place]  Then self quarantine   If you have labs (blood work) drawn today and your tests are completely normal, you will receive your results only by:  MyChart Message (if you have MyChart) OR  A paper copy in the mail If you have any lab test that is abnormal or we need to change your treatment, we will call you to review the results.   Testing/Procedures: Will be schedule at [address] Your physician has requested that you have an exercise tolerance test. Please also follow instruction sheet, as given.   Exercise Stress Test An exercise stress test is a test to check how your heart works during exercise. You will need to walk on a treadmill or ride an exercise bike for this test. An electrocardiogram (ECG) will record your heartbeat when you are at rest and when you are exercising. You may have an ultrasound or nuclear test after the exercise test. The test is done to check for coronary artery disease (CAD). It is also done to:  See how well you can exercise.  Watch for high blood pressure during exercise.  Test how well you can exercise after treatment.  Check the blood flow to your arms and legs. If your test result is not normal, more testing may be needed. What happens before the procedure?  Follow instructions from your doctor about what you cannot eat or drink. ? Do not have any drinks or foods that have caffeine in them for 24 hours before the test, or as told by your doctor. This includes coffee, tea (even decaf tea), sodas, chocolate, and cocoa.  Ask your doctor about changing or stopping your normal medicines. This is important if you: ? Take diabetes medicines. ? Take beta-blocker  medicines. ? Wear a nitroglycerin patch.  If you use an inhaler, bring it with you to the test.  Do not put lotions, powders, creams, or oils on your chest before the test.  Wear comfortable shoes and clothing.  Do not use any products that have nicotine or tobacco in them, such as cigarettes and e-cigarettes. Stop using them at least 4 hours before the test. If you need help quitting, ask your doctor. What happens during the procedure?   Patches (electrodes) will be put on your chest.  Wires will be connected to the patches. The wires will send signals to a machine to record your heartbeat.  Your heart rate will be watched while you are resting and while you are exercising. Your blood pressure will also be watched during the test.  You will walk on a treadmill or use a stationary bike. If you cannot use these, you may be asked to turn a crank with your hands.  The activity will get harder and will raise your heart rate.  You may be asked to breathe into a tube a few times during the test. This measures the gases that you breathe out.  You will be asked how you are feeling throughout the test.  You will exercise until your heart reaches a target heart rate. You will stop early if: ? You feel dizzy. ? You have chest pain. ? You are out of breath. ? Your blood  pressure is too high or too low. ? You have an irregular heartbeat. ? You have pain or aching in your arms or legs. The procedure may vary among doctors and hospitals. What happens after the procedure?  Your blood pressure, heart rate, breathing rate, and blood oxygen level will be watched after the test.  You may return to your normal diet and activities as told by your doctor.  It is up to you to get the results of your test. Ask your doctor, or the department that is doing the test, when your results will be ready. Summary  An exercise stress test is a test to check how your heart works during exercise.  This test  is done to check for coronary artery disease.  Your heart rate will be watched while you are resting and while you are exercising.  Follow instructions from your doctor about what you cannot eat or drink before the test. This information is not intended to replace advice given to you by your health care provider. Make sure you discuss any questions you have with your health care provider. Document Revised: 06/23/2018 Document Reviewed: 06/11/2016 Elsevier Patient Education  2020 [employer].    Follow-Up: At [facility], you and your health needs are our priority.  As part of our continuing mission to provide you with exceptional heart care, we have created designated Provider Care Teams.  These Care Teams include your primary Cardiologist (physician) and Advanced Practice Providers (APPs -  Physician Assistants and Nurse Practitioners) who all work together to provide you with the care you need, when you need it.  We recommend signing up for the patient portal called "MyChart".  Sign up information is provided on this After Visit Summary.  MyChart is used to connect with patients for Virtual Visits (Telemedicine).  Patients are able to view lab/test results, encounter notes, upcoming appointments, etc.  Non-urgent messages can be sent to your provider as well.   To learn more about what you can do with MyChart, go to [email or web address].    Your next appointment:   1 month(s)  The format for your next appointment:    virtual or in person   Provider:   Zhane Lemma, MD

## 2020-01-21 ENCOUNTER — Telehealth (HOSPITAL_COMMUNITY): Payer: Self-pay | Admitting: *Deleted

## 2020-01-21 DIAGNOSIS — G4733 Obstructive sleep apnea (adult) (pediatric): Secondary | ICD-10-CM | POA: Diagnosis not present

## 2020-01-21 NOTE — Telephone Encounter (Signed)
Close encounter 

## 2020-01-25 ENCOUNTER — Other Ambulatory Visit: Payer: Self-pay

## 2020-01-25 ENCOUNTER — Encounter: Payer: Self-pay | Admitting: Cardiology

## 2020-01-25 ENCOUNTER — Ambulatory Visit (HOSPITAL_COMMUNITY)
Admission: RE | Admit: 2020-01-25 | Discharge: 2020-01-25 | Disposition: A | Discharge status: Home / Self Care | Payer: BC Managed Care – PPO | Source: Ambulatory Visit | Attending: Cardiovascular Disease | Admitting: Cardiovascular Disease

## 2020-01-25 DIAGNOSIS — Z8249 Family history of ischemic heart disease and other diseases of the circulatory system: Secondary | ICD-10-CM

## 2020-01-25 DIAGNOSIS — R0789 Other chest pain: Secondary | ICD-10-CM | POA: Diagnosis not present

## 2020-01-25 DIAGNOSIS — R079 Chest pain, unspecified: Secondary | ICD-10-CM

## 2020-01-25 HISTORY — PX: NM MYOVIEW LTD: HXRAD82

## 2020-01-25 LAB — MYOCARDIAL PERFUSION IMAGING
LV dias vol: 172 mL (ref 62–150)
LV sys vol: 98 mL
Peak HR: 83 {beats}/min
Rest HR: 72 {beats}/min
SRS: 0
SSS: 3
TID: 1.17

## 2020-01-25 MED ORDER — TECHNETIUM TC 99M TETROFOSMIN IV KIT
9.8000 | PACK | Freq: Once | INTRAVENOUS | Status: AC | PRN
  Administered 2020-01-25: 9.8 via INTRAVENOUS
  Filled 2020-01-25: qty 10

## 2020-01-25 MED ORDER — TECHNETIUM TC 99M TETROFOSMIN IV KIT
30.3000 | PACK | Freq: Once | INTRAVENOUS | Status: AC | PRN
  Administered 2020-01-25: 30.3 via INTRAVENOUS
  Filled 2020-01-25: qty 31

## 2020-01-25 MED ORDER — REGADENOSON 0.4 MG/5ML IV SOLN
0.4000 mg | Freq: Once | INTRAVENOUS | Status: AC
  Administered 2020-01-25: 0.4 mg via INTRAVENOUS

## 2020-01-28 ENCOUNTER — Telehealth: Payer: Self-pay | Admitting: *Deleted

## 2020-01-28 ENCOUNTER — Telehealth (HOSPITAL_COMMUNITY): Payer: Self-pay

## 2020-01-28 DIAGNOSIS — R0789 Other chest pain: Secondary | ICD-10-CM

## 2020-01-28 DIAGNOSIS — R079 Chest pain, unspecified: Secondary | ICD-10-CM

## 2020-01-28 DIAGNOSIS — Z8249 Family history of ischemic heart disease and other diseases of the circulatory system: Secondary | ICD-10-CM

## 2020-01-28 NOTE — Telephone Encounter (Signed)
The patient has been notified of the result and verbalized understanding.  All questions (if any) were answered. Patient is aware he will need echo prior next appointment  patient states he is available  Anytime. Tobin Chad, RN 01/28/2020 2:47 PM

## 2020-01-28 NOTE — Telephone Encounter (Signed)
-----   Message from Marykay Lex, MD sent at 01/26/2020  2:27 PM EDT ----- Interesting results.  There does not appear to be any evidence of prior heart attack or ongoing active heart artery blockages to cause symptoms.  However, the ejection fraction appears to be reduced at about 40 to 45%.  I would like to check a 2D echocardiogram to get a better assessment of the EF prior to his follow-up.  Blayde Lemma, MD

## 2020-01-28 NOTE — Telephone Encounter (Signed)
Spoke to patient regarding Echo appointment. Scheduled 02/02/20 at 9:15. Arrival time at first floor admission office. Patient voiced his understanding

## 2020-01-31 ENCOUNTER — Other Ambulatory Visit: Payer: Self-pay

## 2020-01-31 ENCOUNTER — Encounter: Payer: Self-pay | Admitting: Plastic Surgery

## 2020-01-31 ENCOUNTER — Other Ambulatory Visit (HOSPITAL_COMMUNITY)
Admission: RE | Admit: 2020-01-31 | Discharge: 2020-01-31 | Disposition: A | Discharge status: Home / Self Care | Payer: BC Managed Care – PPO | Source: Ambulatory Visit | Attending: Plastic Surgery | Admitting: Plastic Surgery

## 2020-01-31 ENCOUNTER — Ambulatory Visit (INDEPENDENT_AMBULATORY_CARE_PROVIDER_SITE_OTHER): Payer: BC Managed Care – PPO | Admitting: Plastic Surgery

## 2020-01-31 VITALS — BP 143/90 | HR 73 | Temp 98.0°F

## 2020-01-31 DIAGNOSIS — L989 Disorder of the skin and subcutaneous tissue, unspecified: Secondary | ICD-10-CM | POA: Insufficient documentation

## 2020-01-31 DIAGNOSIS — D487 Neoplasm of uncertain behavior of other specified sites: Secondary | ICD-10-CM | POA: Diagnosis not present

## 2020-01-31 NOTE — Progress Notes (Signed)
Operative Note   DATE OF OPERATION: 01/31/2020  LOCATION:    SURGICAL DEPARTMENT: Plastic Surgery  PREOPERATIVE DIAGNOSES: 1.  Left scalp lipoma 2.  Right knee cyst 3.  Left long finger cyst  POSTOPERATIVE DIAGNOSES:  same  PROCEDURE:  1. Excision of submuscular left scalp lipoma measuring 3 cm 2. Complex closure left scalp measuring 3 cm 3. Excision of the right knee subcutaneous cyst measuring 3 cm 4. Complex closure right knee totaling 3 cm 5. Excision of left long finger cyst measuring 1.5 cm 6. Complex closure left long finger totaling 1.5 cm  SURGEON: Ancil Linsey, MD  ANESTHESIA:  Local  COMPLICATIONS: None.   INDICATIONS FOR PROCEDURE:  The patient, Jay Patton is a 59 y.o. male born on 01-18-1961, is here for treatment of a left scalp lipoma, right knee cyst, and left long finger cyst MRN: [id number]  CONSENT:  Informed consent was obtained directly from the patient. Risks, benefits and alternatives were fully discussed. Specific risks including but not limited to bleeding, infection, hematoma, seroma, scarring, pain, infection, wound healing problems, and need for further surgery were all discussed. The patient did have an ample opportunity to have questions answered to satisfaction.   DESCRIPTION OF PROCEDURE:  Local anesthesia was administered. The patient's operative site was prepped and draped in a sterile fashion. A time out was performed and all information was confirmed to be correct.  The lesions were excised with a 15 blade and tenotomy dissection.  The scalp lipoma was beneath the galea layer.  The other lesions were subcutaneous cystic lesions.  Hemostasis was obtained.  Circumferential undermining was performed and the skin was advanced and closed in layers with interrupted buried Monocryl sutures and Monocryl for the skin.    The patient tolerated the procedure well.  There were no complications.

## 2020-02-02 ENCOUNTER — Telehealth: Payer: Self-pay | Admitting: Plastic Surgery

## 2020-02-02 ENCOUNTER — Ambulatory Visit (HOSPITAL_COMMUNITY)
Admission: RE | Admit: 2020-02-02 | Discharge: 2020-02-02 | Disposition: A | Discharge status: Home / Self Care | Payer: BC Managed Care – PPO | Source: Ambulatory Visit | Attending: Cardiology | Admitting: Cardiology

## 2020-02-02 ENCOUNTER — Other Ambulatory Visit: Payer: Self-pay

## 2020-02-02 DIAGNOSIS — I340 Nonrheumatic mitral (valve) insufficiency: Secondary | ICD-10-CM | POA: Insufficient documentation

## 2020-02-02 DIAGNOSIS — E785 Hyperlipidemia, unspecified: Secondary | ICD-10-CM | POA: Diagnosis not present

## 2020-02-02 DIAGNOSIS — Z8249 Family history of ischemic heart disease and other diseases of the circulatory system: Secondary | ICD-10-CM

## 2020-02-02 DIAGNOSIS — R0789 Other chest pain: Secondary | ICD-10-CM

## 2020-02-02 DIAGNOSIS — I517 Cardiomegaly: Secondary | ICD-10-CM | POA: Insufficient documentation

## 2020-02-02 DIAGNOSIS — R079 Chest pain, unspecified: Secondary | ICD-10-CM

## 2020-02-02 HISTORY — PX: TRANSTHORACIC ECHOCARDIOGRAM: SHX275

## 2020-02-02 LAB — ECHOCARDIOGRAM COMPLETE
Area-P 1/2: 4.31 cm2
Calc EF: 56.5 %
S' Lateral: 4 cm
Single Plane A2C EF: 62.6 %
Single Plane A4C EF: 47.2 %

## 2020-02-02 LAB — SURGICAL PATHOLOGY

## 2020-02-02 NOTE — Telephone Encounter (Signed)
Returned patients call. He indicated he felt a new spot of a pocket of fluid this morning approximately 5 inches down from where he had the cyst removed Monday.  It has gone down in size this afternoon. He does not know if he had it before the surgery of not.  Offered for him to make an appointment for observation.  At this time, patient wants to wait a few days to see if goes away or changes.

## 2020-02-02 NOTE — Progress Notes (Signed)
Virtual Visit via Telephone Note   This visit type was conducted due to national recommendations for restrictions regarding the COVID-19 Pandemic (e.g. social distancing) in an effort to limit this patient's exposure and mitigate transmission in our community.  Due to his co-morbid illnesses, this patient is at least at moderate risk for complications without adequate follow up.  This format is felt to be most appropriate for this patient at this time.  The patient did not have access to video technology/had technical difficulties with video requiring transitioning to audio format only (telephone).  All issues noted in this document were discussed and addressed.  No physical exam could be performed with this format.  Please refer to the patient's chart for his  consent to telehealth for [facility]   Patient has given verbal permission to conduct this visit via virtual appointment and to bill insurance 02/03/2020 3:48 PM     Evaluation Performed:  Follow-up visit  Date:  02/03/2020   ID:  Jay Patton, DOB 1960/04/11, MRN [id number]  Patient Location: Home Provider Location: Office/Clinic  PCP:  Merri Brunette, MD  Cardiologist:  Aurthur Lemma, MD  Electrophysiologist:  None   Chief Complaint:     History of Present Illness:    Jay Patton is a 59 y.o. male with PMH notable for HYPERLIPIDEMIA and HYPOTHYROIDISM who presents via audio/video conferencing for a telehealth visit today as a 1 month follow-up evaluation for CHEST PAIN -> reviewed results of Echocardiogram and Myoview.  Problem List Items Addressed This Visit    Atypical chest pain   Hyperlipidemia - Primary   Family history of coronary artery disease in mother    Jay Patton was seen for initial consultation on October 26 at the request of Dr. Merri Brunette.  This was in response to her ER visit on September 27.  He had off-and-on spells of sharp discomfort underneath the left breast.  On occasion it was  associated with dyspnea.  He then described it as a pressure after the sharp sensation.  He indicated that it came on with walking.  Otherwise was stable with remainder of his cardiovascular symptoms being negative.Marland Kitchen  Hospitalizations:  . None since last visit  Recent - Interim CV studies:   The following studies were reviewed today: . Lexiscan Myoview 01/25/2020: EF 40 to 45%.  No EKG changes.  LOW Risk.  No evidence of ischemia or infarction.  (EF thought to be higher than calculated EF, recommend 2D echo) . Echo 02/02/2020: EF 50 to 55%.  No R WMA.  Mild LVH with GR 1 DD.  Normal RV.  Mild MR.  Normal aortic valve.  Normal CVP.--Essentially normal.  Inerval History   Jay Patton is being seen today via telemedicine to review results of his tests.  He indicates that he is feeling fine with no further episodes of chest discomfort.  He is a little bit interested in the fact that his blood pressures have been higher of late.  He said he had some cyst removed a few days ago, and his pressures were high, and then when he went in for his echocardiogram his blood pressure was also relatively high.  Roughly 140/80 mmHg which is not usual for him. He has not had any exertional dyspnea.  No PND or orthopnea.  He is active, but indicates that he probably is not as active as he should be.  Mostly asked questions about risk factors going forward and other potential etiologies for  his chest pain.  Cardiovascular ROS: no chest pain or dyspnea on exertion positive for - Maybe little exercise intolerance negative for - edema, irregular heartbeat, orthopnea, palpitations, paroxysmal nocturnal dyspnea, rapid heart rate, shortness of breath or Syncope/near syncope, TIA/amaurosis fugax.  Claudication  ROS:  Please see the history of present illness.    The patient does not have symptoms concerning for COVID-19 infection (fever, chills, cough, or new shortness of breath).  Pertinent symptoms noted  above. Remainder the Review of Systems: ROS   The patient is practicing social distancing.  Past Medical History:  Diagnosis Date  . Arm fracture, late effect 05/15/2012  . GAD (generalized anxiety disorder)   . Hypothyroidism    Taking levothyroxine/Synthroid 275 mcg  . Obesity (BMI 30.0-34.9)   . OSA on CPAP   . TMJ (dislocation of temporomandibular joint)    Past Surgical History:  Procedure Laterality Date  . EYE MUSCLE SURGERY     Surgery for amblyopia  . NM MYOVIEW LTD  01/25/2020   Lexiscan:  EF 40 to 45%.  No EKG changes.  LOW Risk.  No evidence of ischemia or infarction.  (EF thought to be higher than calculated EF, recommend 2D echo)  . TRANSTHORACIC ECHOCARDIOGRAM  02/02/2020   EF 50 to 55%.  No R WMA.  Mild LVH with GR 1 DD.  Normal RV.  Mild MR.  Normal aortic valve.  Normal CVP.--Essentially normal.     Current Meds  Medication Sig  . atorvastatin (LIPITOR) 10 MG tablet Take 10 mg by mouth daily.   Marland Kitchen levothyroxine (SYNTHROID) 75 MCG tablet Take 75 mcg by mouth daily.  . sertraline (ZOLOFT) 100 MG tablet Take 100 mg by mouth daily.  Marland Kitchen SYNTHROID 200 MCG tablet Take 200 mcg by mouth daily.     Allergies:   Sulfa antibiotics   Social History   Tobacco Use  . Smoking status: Never Smoker  . Smokeless tobacco: Never Used  Substance Use Topics  . Alcohol use: Yes    Alcohol/week: 2.0 standard drinks    Types: 2 Standard drinks or equivalent per week    Comment: Monitor drinks a week  . Drug use: Never     Family Hx: The patient's family history includes Dementia in his father; Heart disease in his mother; Stroke in his father.   Labs/Other Tests and Data Reviewed:    EKG:  No ECG reviewed.  Recent Labs: 12/20/2019: BUN 13; Creatinine, Ser 0.96; Hemoglobin 13.8; Platelets 215; Potassium 4.0; Sodium 138   Recent Lipid Panel No results found for: CHOL, TRIG, HDL, CHOLHDL, LDLCALC, LDLDIRECT  Wt Readings from Last 3 Encounters:  02/03/20 227 lb (103  kg)  01/25/20 227 lb (103 kg)  01/18/20 227 lb 12.8 oz (103.3 kg)     Objective:    Vital Signs:  BP 140/80   Ht 6\' 1"  (1.854 m)   Wt 227 lb (103 kg)   BMI 29.95 kg/m   VITAL SIGNS:  reviewed Pleasant male in no acute distress. A&O x 3.  Normal Mood & Affect Non-labored respirations   ASSESSMENT & PLAN:    Problem List Items Addressed This Visit    Hyperlipidemia with target LDL less than 100 - Primary (Chronic)    Mostly because of family history, age and hypertension, not unreasonable to target LDL less than 100 based on his underlying risk.  Is already on statin.  Monitored by PCP.  Will defer further management to PCP.  Cardiac evaluation has been  negative.      Atypical chest pain    Chest pain with no recurrent symptoms now.  Probably musculoskeletal in nature.  We talked about different potential etiologies.  Low risk stress test and normal echocardiogram.  Very reassuring.      Family history of coronary artery disease in mother    With family history, borderline blood pressure and hyperlipidemia, would recommend treatment of risk factors.  This can be done by PCP.  I indicated that he may end up on antihypertensive agent per PCP.  His current blood pressures have been drifting up. He has been started on atorvastatin.  Target LDL would be roughly 100 given his family history.  Defer further management to PCP.         COVID-19 Education: The signs and symptoms of COVID-19 were discussed with the patient and how to seek care for testing (follow up with PCP or arrange E-visit).   The importance of social distancing was discussed today.  Time:   Today, I have spent 18 minutes with the patient with telehealth technology discussing the above problems.  Additional 15time spent in charting. Total time: 33 min   Medication Adjustments/Labs and Tests Ordered: Current medicines are reviewed at length with the patient today.  Concerns regarding medicines are outlined  above.   Patient Instructions  Medication Instructions:  No changes for now.  Will defer consideration of possible blood pressure medication to Dr. Renne Crigler based on follow-up blood pressures.  For now would recommend increased exercise, diet change with weight loss which will help both your blood pressure and your cholesterol levels.     Lab Work: None    Testing/Procedures: No need for further testing.  Both your test results look very good.  The stress test likely underestimated your pump function which the echocardiogram confirmed as being normal.  The study did show that your left ventricle is mildly thickened not unexpected based on your height and weight.  This finding also goes along with your heart getting slightly stiffer with age which is a natural feature of aging. ->  Best therapy for this is increasing exercise, and blood pressure control.   Follow-Up: At [facility], you and your health needs are our priority.  As part of our continuing mission to provide you with exceptional heart care, we have created designated Provider Care Teams.  These Care Teams include your primary Cardiologist (physician) and Advanced Practice Providers (APPs -  Physician Assistants and Nurse Practitioners) who all work together to provide you with the care you need, when you need it.  We recommend signing up for the patient portal called "MyChart".  Sign up information is provided on this After Visit Summary.  MyChart is used to connect with patients for Virtual Visits (Telemedicine).  Patients are able to view lab/test results, encounter notes, upcoming appointments, etc.  Non-urgent messages can be sent to your provider as well.   To learn more about what you can do with MyChart, go to [email or web address].    Your next appointment:   As needed   The format for your next appointment:   Within the next 3 years either in person or virtual.  After 3 years, you become a new patient  again.  Provider:   You may see Elison Lemma, MD or one of the following Advanced Practice Providers on your designated Care Team:    Theodore Demark, PA-C  Joni Reining, DNP, ANP    Other Instructions Keep working on weight loss,  diet changes.  Goal will be to maintain your cholesterol levels with an LDL (bad cholesterol) at least below 100 if not closer to 70 based on your family history of heart disease.  But also monitor your blood pressure with goal blood pressure less than 135/85 mmHg.  Most of this can probably be achieved with diet, exercise along with low-dose of atorvastatin.  If not at goal, may have Dr. Renne CriglerPharr increase the atorvastatin, and if blood pressures drift up, you may need to start a low-dose blood pressure medication per Dr. Renne CriglerPharr.  Do not hesitate to call us back to be seen if there are any issues.  If seen within the next 3 years you remain active patient.  Demichael Lemmaavid Aidel Davisson, MD      Signed, Raahil Lemmaavid Zela Sobieski, MD  02/03/2020 3:48 PM    [facility]

## 2020-02-02 NOTE — Progress Notes (Signed)
Echocardiogram 2D Echocardiogram has been performed.  Jay Patton 02/02/2020, 9:32 AM

## 2020-02-02 NOTE — Telephone Encounter (Signed)
Patient noticed a little fluid filled pouch or at least what appears to be be fluid under the area where the cyst was removed earlier this week. He would like to know if this is normal. Please call to advise.

## 2020-02-03 ENCOUNTER — Telehealth: Payer: Self-pay | Admitting: *Deleted

## 2020-02-03 ENCOUNTER — Telehealth (INDEPENDENT_AMBULATORY_CARE_PROVIDER_SITE_OTHER): Payer: BC Managed Care – PPO | Admitting: Cardiology

## 2020-02-03 ENCOUNTER — Encounter: Payer: Self-pay | Admitting: Cardiology

## 2020-02-03 VITALS — BP 140/80 | Ht 73.0 in | Wt 227.0 lb

## 2020-02-03 DIAGNOSIS — R0789 Other chest pain: Secondary | ICD-10-CM

## 2020-02-03 DIAGNOSIS — E782 Mixed hyperlipidemia: Secondary | ICD-10-CM

## 2020-02-03 DIAGNOSIS — Z8249 Family history of ischemic heart disease and other diseases of the circulatory system: Secondary | ICD-10-CM

## 2020-02-03 DIAGNOSIS — E785 Hyperlipidemia, unspecified: Secondary | ICD-10-CM | POA: Diagnosis not present

## 2020-02-03 NOTE — Assessment & Plan Note (Signed)
With family history, borderline blood pressure and hyperlipidemia, would recommend treatment of risk factors.  This can be done by PCP.  I indicated that he may end up on antihypertensive agent per PCP.  His current blood pressures have been drifting up. He has been started on atorvastatin.  Target LDL would be roughly 100 given his family history.  Defer further management to PCP.

## 2020-02-03 NOTE — Telephone Encounter (Signed)
°  Patient Consent for Virtual Visit         Jay Patton has provided verbal consent on 02/03/2020 for a virtual visit (video or telephone).   CONSENT FOR VIRTUAL VISIT FOR:  Jay Patton  By participating in this virtual visit I agree to the following:  I hereby voluntarily request, consent and authorize [facility] and its employed or contracted physicians, physician assistants, nurse practitioners or other licensed health care professionals (the Practitioner), to provide me with telemedicine health care services (the Services") as deemed necessary by the treating Practitioner. I acknowledge and consent to receive the Services by the Practitioner via telemedicine. I understand that the telemedicine visit will involve communicating with the Practitioner through live audiovisual communication technology and the disclosure of certain medical information by electronic transmission. I acknowledge that I have been given the opportunity to request an in-person assessment or other available alternative prior to the telemedicine visit and am voluntarily participating in the telemedicine visit.  I understand that I have the right to withhold or withdraw my consent to the use of telemedicine in the course of my care at any time, without affecting my right to future care or treatment, and that the Practitioner or I may terminate the telemedicine visit at any time. I understand that I have the right to inspect all information obtained and/or recorded in the course of the telemedicine visit and may receive copies of available information for a reasonable fee.  I understand that some of the potential risks of receiving the Services via telemedicine include:   Delay or interruption in medical evaluation due to technological equipment failure or disruption;  Information transmitted may not be sufficient (e.g. poor resolution of images) to allow for appropriate medical decision making by the  Practitioner; and/or   In rare instances, security protocols could fail, causing a breach of personal health information.  Furthermore, I acknowledge that it is my responsibility to provide information about my medical history, conditions and care that is complete and accurate to the best of my ability. I acknowledge that Practitioner's advice, recommendations, and/or decision may be based on factors not within their control, such as incomplete or inaccurate data provided by me or distortions of diagnostic images or specimens that may result from electronic transmissions. I understand that the practice of medicine is not an exact science and that Practitioner makes no warranties or guarantees regarding treatment outcomes. I acknowledge that a copy of this consent can be made available to me via my patient portal [facility] MyChart), or I can request a printed copy by calling the office of [facility].    I understand that my insurance will be billed for this visit.   I have read or had this consent read to me.  I understand the contents of this consent, which adequately explains the benefits and risks of the Services being provided via telemedicine.   I have been provided ample opportunity to ask questions regarding this consent and the Services and have had my questions answered to my satisfaction.  I give my informed consent for the services to be provided through the use of telemedicine in my medical care

## 2020-02-03 NOTE — Telephone Encounter (Signed)
RN spoke to patient. Instruction were given  from today's virtual visit 02/03/20 .  AVS SUMMARY has been sent by [facility] and mailed.   Patient verbalized understanding

## 2020-02-03 NOTE — Assessment & Plan Note (Signed)
Chest pain with no recurrent symptoms now.  Probably musculoskeletal in nature.  We talked about different potential etiologies.  Low risk stress test and normal echocardiogram.  Very reassuring.

## 2020-02-03 NOTE — Assessment & Plan Note (Signed)
Mostly because of family history, age and hypertension, not unreasonable to target LDL less than 100 based on his underlying risk.  Is already on statin.  Monitored by PCP.  Will defer further management to PCP.  Cardiac evaluation has been negative.

## 2020-02-03 NOTE — Patient Instructions (Addendum)
Medication Instructions:  No changes for now.  Will defer consideration of possible blood pressure medication to Dr. Renne Crigler based on follow-up blood pressures.  For now would recommend increased exercise, diet change with weight loss which will help both your blood pressure and your cholesterol levels.     Lab Work: None    Testing/Procedures: No need for further testing.  Both your test results look very good.  The stress test likely underestimated your pump function which the echocardiogram confirmed as being normal.  The study did show that your left ventricle is mildly thickened not unexpected based on your height and weight.  This finding also goes along with your heart getting slightly stiffer with age which is a natural feature of aging. ->  Best therapy for this is increasing exercise, and blood pressure control.   Follow-Up: At [facility], you and your health needs are our priority.  As part of our continuing mission to provide you with exceptional heart care, we have created designated Provider Care Teams.  These Care Teams include your primary Cardiologist (physician) and Advanced Practice Providers (APPs -  Physician Assistants and Nurse Practitioners) who all work together to provide you with the care you need, when you need it.  We recommend signing up for the patient portal called "MyChart".  Sign up information is provided on this After Visit Summary.  MyChart is used to connect with patients for Virtual Visits (Telemedicine).  Patients are able to view lab/test results, encounter notes, upcoming appointments, etc.  Non-urgent messages can be sent to your provider as well.   To learn more about what you can do with MyChart, go to [email or web address].    Your next appointment:   As needed   The format for your next appointment:   Within the next 3 years either in person or virtual.  After 3 years, you become a new patient again.  Provider:   You may see Jay Lemma, MD or one of the following Advanced Practice Providers on your designated Care Team:    Theodore Demark, PA-C  Joni Reining, DNP, ANP    Other Instructions Keep working on weight loss, diet changes.  Goal will be to maintain your cholesterol levels with an LDL (bad cholesterol) at least below 100 if not closer to 70 based on your family history of heart disease.  But also monitor your blood pressure with goal blood pressure less than 135/85 mmHg.  Most of this can probably be achieved with diet, exercise along with low-dose of atorvastatin.  If not at goal, may have Dr. Renne Crigler increase the atorvastatin, and if blood pressures drift up, you may need to start a low-dose blood pressure medication per Dr. Renne Crigler.  Do not hesitate to call us back to be seen if there are any issues.  If seen within the next 3 years you remain active patient.  Jay Lemma, MD

## 2020-03-06 DIAGNOSIS — H53001 Unspecified amblyopia, right eye: Secondary | ICD-10-CM | POA: Diagnosis not present

## 2020-03-06 DIAGNOSIS — H04123 Dry eye syndrome of bilateral lacrimal glands: Secondary | ICD-10-CM | POA: Diagnosis not present

## 2020-03-06 DIAGNOSIS — H524 Presbyopia: Secondary | ICD-10-CM | POA: Diagnosis not present

## 2020-03-06 DIAGNOSIS — D3131 Benign neoplasm of right choroid: Secondary | ICD-10-CM | POA: Diagnosis not present

## 2020-04-12 DIAGNOSIS — M62830 Muscle spasm of back: Secondary | ICD-10-CM | POA: Diagnosis not present

## 2020-04-12 DIAGNOSIS — S335XXA Sprain of ligaments of lumbar spine, initial encounter: Secondary | ICD-10-CM | POA: Diagnosis not present

## 2020-04-12 DIAGNOSIS — M9905 Segmental and somatic dysfunction of pelvic region: Secondary | ICD-10-CM | POA: Diagnosis not present

## 2020-04-12 DIAGNOSIS — M9903 Segmental and somatic dysfunction of lumbar region: Secondary | ICD-10-CM | POA: Diagnosis not present

## 2020-04-13 DIAGNOSIS — M9905 Segmental and somatic dysfunction of pelvic region: Secondary | ICD-10-CM | POA: Diagnosis not present

## 2020-04-13 DIAGNOSIS — M62830 Muscle spasm of back: Secondary | ICD-10-CM | POA: Diagnosis not present

## 2020-04-13 DIAGNOSIS — S335XXA Sprain of ligaments of lumbar spine, initial encounter: Secondary | ICD-10-CM | POA: Diagnosis not present

## 2020-04-13 DIAGNOSIS — M9903 Segmental and somatic dysfunction of lumbar region: Secondary | ICD-10-CM | POA: Diagnosis not present

## 2020-04-17 DIAGNOSIS — M62830 Muscle spasm of back: Secondary | ICD-10-CM | POA: Diagnosis not present

## 2020-04-17 DIAGNOSIS — M9903 Segmental and somatic dysfunction of lumbar region: Secondary | ICD-10-CM | POA: Diagnosis not present

## 2020-04-17 DIAGNOSIS — S335XXA Sprain of ligaments of lumbar spine, initial encounter: Secondary | ICD-10-CM | POA: Diagnosis not present

## 2020-04-17 DIAGNOSIS — M9905 Segmental and somatic dysfunction of pelvic region: Secondary | ICD-10-CM | POA: Diagnosis not present

## 2020-05-26 DIAGNOSIS — G4733 Obstructive sleep apnea (adult) (pediatric): Secondary | ICD-10-CM | POA: Diagnosis not present

## 2020-09-05 DIAGNOSIS — G4733 Obstructive sleep apnea (adult) (pediatric): Secondary | ICD-10-CM | POA: Diagnosis not present

## 2020-09-11 DIAGNOSIS — L308 Other specified dermatitis: Secondary | ICD-10-CM | POA: Diagnosis not present

## 2020-09-11 DIAGNOSIS — L814 Other melanin hyperpigmentation: Secondary | ICD-10-CM | POA: Diagnosis not present

## 2020-09-11 DIAGNOSIS — L708 Other acne: Secondary | ICD-10-CM | POA: Diagnosis not present

## 2020-09-11 DIAGNOSIS — L570 Actinic keratosis: Secondary | ICD-10-CM | POA: Diagnosis not present

## 2020-12-18 DIAGNOSIS — G4733 Obstructive sleep apnea (adult) (pediatric): Secondary | ICD-10-CM | POA: Diagnosis not present

## 2021-01-03 DIAGNOSIS — Z0000 Encounter for general adult medical examination without abnormal findings: Secondary | ICD-10-CM | POA: Diagnosis not present

## 2021-01-03 DIAGNOSIS — Z125 Encounter for screening for malignant neoplasm of prostate: Secondary | ICD-10-CM | POA: Diagnosis not present

## 2021-01-04 DIAGNOSIS — G4733 Obstructive sleep apnea (adult) (pediatric): Secondary | ICD-10-CM | POA: Diagnosis not present

## 2021-01-10 DIAGNOSIS — Z0000 Encounter for general adult medical examination without abnormal findings: Secondary | ICD-10-CM | POA: Diagnosis not present

## 2021-01-10 DIAGNOSIS — Z23 Encounter for immunization: Secondary | ICD-10-CM | POA: Diagnosis not present

## 2021-01-10 DIAGNOSIS — E7800 Pure hypercholesterolemia, unspecified: Secondary | ICD-10-CM | POA: Diagnosis not present

## 2021-01-10 DIAGNOSIS — E039 Hypothyroidism, unspecified: Secondary | ICD-10-CM | POA: Diagnosis not present

## 2021-01-10 DIAGNOSIS — Z8249 Family history of ischemic heart disease and other diseases of the circulatory system: Secondary | ICD-10-CM | POA: Diagnosis not present

## 2021-01-11 ENCOUNTER — Other Ambulatory Visit: Payer: Self-pay | Admitting: Registered Nurse

## 2021-01-11 ENCOUNTER — Other Ambulatory Visit: Payer: Self-pay | Admitting: Internal Medicine

## 2021-01-11 DIAGNOSIS — Z8249 Family history of ischemic heart disease and other diseases of the circulatory system: Secondary | ICD-10-CM

## 2021-01-15 DIAGNOSIS — Z6831 Body mass index (BMI) 31.0-31.9, adult: Secondary | ICD-10-CM | POA: Diagnosis not present

## 2021-01-15 DIAGNOSIS — E785 Hyperlipidemia, unspecified: Secondary | ICD-10-CM | POA: Diagnosis not present

## 2021-01-15 DIAGNOSIS — R946 Abnormal results of thyroid function studies: Secondary | ICD-10-CM | POA: Diagnosis not present

## 2021-02-07 ENCOUNTER — Ambulatory Visit
Admission: RE | Admit: 2021-02-07 | Discharge: 2021-02-07 | Disposition: A | Discharge status: Home / Self Care | Payer: No Typology Code available for payment source | Source: Ambulatory Visit | Attending: Registered Nurse | Admitting: Registered Nurse

## 2021-02-07 DIAGNOSIS — Z8249 Family history of ischemic heart disease and other diseases of the circulatory system: Secondary | ICD-10-CM

## 2021-02-09 DIAGNOSIS — F411 Generalized anxiety disorder: Secondary | ICD-10-CM | POA: Diagnosis not present

## 2021-02-09 DIAGNOSIS — E7800 Pure hypercholesterolemia, unspecified: Secondary | ICD-10-CM | POA: Diagnosis not present

## 2021-02-09 DIAGNOSIS — E039 Hypothyroidism, unspecified: Secondary | ICD-10-CM | POA: Diagnosis not present

## 2021-03-06 DIAGNOSIS — E7800 Pure hypercholesterolemia, unspecified: Secondary | ICD-10-CM | POA: Diagnosis not present

## 2021-03-06 DIAGNOSIS — E039 Hypothyroidism, unspecified: Secondary | ICD-10-CM | POA: Diagnosis not present

## 2021-03-08 DIAGNOSIS — Z6831 Body mass index (BMI) 31.0-31.9, adult: Secondary | ICD-10-CM | POA: Diagnosis not present

## 2021-03-08 DIAGNOSIS — E039 Hypothyroidism, unspecified: Secondary | ICD-10-CM | POA: Diagnosis not present

## 2021-03-08 DIAGNOSIS — E785 Hyperlipidemia, unspecified: Secondary | ICD-10-CM | POA: Diagnosis not present

## 2021-03-10 DIAGNOSIS — R220 Localized swelling, mass and lump, head: Secondary | ICD-10-CM | POA: Diagnosis not present

## 2021-03-10 DIAGNOSIS — K119 Disease of salivary gland, unspecified: Secondary | ICD-10-CM | POA: Diagnosis not present

## 2021-03-10 DIAGNOSIS — K054 Periodontosis: Secondary | ICD-10-CM | POA: Diagnosis not present

## 2021-03-14 DIAGNOSIS — H53001 Unspecified amblyopia, right eye: Secondary | ICD-10-CM | POA: Diagnosis not present

## 2021-03-14 DIAGNOSIS — H5203 Hypermetropia, bilateral: Secondary | ICD-10-CM | POA: Diagnosis not present

## 2021-03-14 DIAGNOSIS — D3131 Benign neoplasm of right choroid: Secondary | ICD-10-CM | POA: Diagnosis not present

## 2021-03-14 DIAGNOSIS — H2513 Age-related nuclear cataract, bilateral: Secondary | ICD-10-CM | POA: Diagnosis not present

## 2021-04-16 DIAGNOSIS — S0502XA Injury of conjunctiva and corneal abrasion without foreign body, left eye, initial encounter: Secondary | ICD-10-CM | POA: Diagnosis not present

## 2021-04-18 DIAGNOSIS — Z9989 Dependence on other enabling machines and devices: Secondary | ICD-10-CM | POA: Diagnosis not present

## 2021-04-18 DIAGNOSIS — E7800 Pure hypercholesterolemia, unspecified: Secondary | ICD-10-CM | POA: Diagnosis not present

## 2021-04-18 DIAGNOSIS — G4733 Obstructive sleep apnea (adult) (pediatric): Secondary | ICD-10-CM | POA: Diagnosis not present

## 2021-04-20 DIAGNOSIS — S0502XD Injury of conjunctiva and corneal abrasion without foreign body, left eye, subsequent encounter: Secondary | ICD-10-CM | POA: Diagnosis not present

## 2021-05-10 DIAGNOSIS — E785 Hyperlipidemia, unspecified: Secondary | ICD-10-CM | POA: Diagnosis not present

## 2021-05-10 DIAGNOSIS — E039 Hypothyroidism, unspecified: Secondary | ICD-10-CM | POA: Diagnosis not present

## 2021-05-15 ENCOUNTER — Other Ambulatory Visit: Payer: Self-pay | Admitting: Registered Nurse

## 2021-05-15 DIAGNOSIS — Z6831 Body mass index (BMI) 31.0-31.9, adult: Secondary | ICD-10-CM | POA: Diagnosis not present

## 2021-05-15 DIAGNOSIS — E785 Hyperlipidemia, unspecified: Secondary | ICD-10-CM | POA: Diagnosis not present

## 2021-05-15 DIAGNOSIS — E010 Iodine-deficiency related diffuse (endemic) goiter: Secondary | ICD-10-CM

## 2021-05-15 DIAGNOSIS — E039 Hypothyroidism, unspecified: Secondary | ICD-10-CM | POA: Diagnosis not present

## 2021-05-15 DIAGNOSIS — R5383 Other fatigue: Secondary | ICD-10-CM | POA: Diagnosis not present

## 2021-05-18 ENCOUNTER — Other Ambulatory Visit: Payer: Self-pay

## 2021-05-18 ENCOUNTER — Ambulatory Visit
Admission: RE | Admit: 2021-05-18 | Discharge: 2021-05-18 | Disposition: A | Discharge status: Home / Self Care | Payer: BC Managed Care – PPO | Source: Ambulatory Visit | Attending: Registered Nurse | Admitting: Registered Nurse

## 2021-05-18 DIAGNOSIS — E049 Nontoxic goiter, unspecified: Secondary | ICD-10-CM | POA: Diagnosis not present

## 2021-05-18 DIAGNOSIS — E010 Iodine-deficiency related diffuse (endemic) goiter: Secondary | ICD-10-CM

## 2021-05-21 DIAGNOSIS — G4733 Obstructive sleep apnea (adult) (pediatric): Secondary | ICD-10-CM | POA: Diagnosis not present

## 2021-05-28 DIAGNOSIS — F411 Generalized anxiety disorder: Secondary | ICD-10-CM | POA: Diagnosis not present

## 2021-05-28 DIAGNOSIS — E039 Hypothyroidism, unspecified: Secondary | ICD-10-CM | POA: Diagnosis not present

## 2021-05-28 DIAGNOSIS — E291 Testicular hypofunction: Secondary | ICD-10-CM | POA: Diagnosis not present

## 2021-05-28 DIAGNOSIS — S335XXA Sprain of ligaments of lumbar spine, initial encounter: Secondary | ICD-10-CM | POA: Diagnosis not present

## 2021-05-28 DIAGNOSIS — G4733 Obstructive sleep apnea (adult) (pediatric): Secondary | ICD-10-CM | POA: Diagnosis not present

## 2021-05-28 DIAGNOSIS — M62830 Muscle spasm of back: Secondary | ICD-10-CM | POA: Diagnosis not present

## 2021-05-28 DIAGNOSIS — M9903 Segmental and somatic dysfunction of lumbar region: Secondary | ICD-10-CM | POA: Diagnosis not present

## 2021-05-28 DIAGNOSIS — M9905 Segmental and somatic dysfunction of pelvic region: Secondary | ICD-10-CM | POA: Diagnosis not present

## 2021-06-01 DIAGNOSIS — E291 Testicular hypofunction: Secondary | ICD-10-CM | POA: Diagnosis not present

## 2021-06-18 DIAGNOSIS — G4733 Obstructive sleep apnea (adult) (pediatric): Secondary | ICD-10-CM | POA: Diagnosis not present

## 2021-07-09 DIAGNOSIS — M9903 Segmental and somatic dysfunction of lumbar region: Secondary | ICD-10-CM | POA: Diagnosis not present

## 2021-07-09 DIAGNOSIS — M9905 Segmental and somatic dysfunction of pelvic region: Secondary | ICD-10-CM | POA: Diagnosis not present

## 2021-07-09 DIAGNOSIS — M62830 Muscle spasm of back: Secondary | ICD-10-CM | POA: Diagnosis not present

## 2021-07-09 DIAGNOSIS — S335XXA Sprain of ligaments of lumbar spine, initial encounter: Secondary | ICD-10-CM | POA: Diagnosis not present

## 2021-07-10 DIAGNOSIS — M9903 Segmental and somatic dysfunction of lumbar region: Secondary | ICD-10-CM | POA: Diagnosis not present

## 2021-07-10 DIAGNOSIS — M9905 Segmental and somatic dysfunction of pelvic region: Secondary | ICD-10-CM | POA: Diagnosis not present

## 2021-07-10 DIAGNOSIS — S335XXA Sprain of ligaments of lumbar spine, initial encounter: Secondary | ICD-10-CM | POA: Diagnosis not present

## 2021-07-10 DIAGNOSIS — M62830 Muscle spasm of back: Secondary | ICD-10-CM | POA: Diagnosis not present

## 2021-07-11 DIAGNOSIS — M9903 Segmental and somatic dysfunction of lumbar region: Secondary | ICD-10-CM | POA: Diagnosis not present

## 2021-07-11 DIAGNOSIS — M62830 Muscle spasm of back: Secondary | ICD-10-CM | POA: Diagnosis not present

## 2021-07-11 DIAGNOSIS — M9905 Segmental and somatic dysfunction of pelvic region: Secondary | ICD-10-CM | POA: Diagnosis not present

## 2021-07-11 DIAGNOSIS — S335XXA Sprain of ligaments of lumbar spine, initial encounter: Secondary | ICD-10-CM | POA: Diagnosis not present

## 2021-07-16 DIAGNOSIS — S335XXA Sprain of ligaments of lumbar spine, initial encounter: Secondary | ICD-10-CM | POA: Diagnosis not present

## 2021-07-16 DIAGNOSIS — M9905 Segmental and somatic dysfunction of pelvic region: Secondary | ICD-10-CM | POA: Diagnosis not present

## 2021-07-16 DIAGNOSIS — M9903 Segmental and somatic dysfunction of lumbar region: Secondary | ICD-10-CM | POA: Diagnosis not present

## 2021-07-16 DIAGNOSIS — M62830 Muscle spasm of back: Secondary | ICD-10-CM | POA: Diagnosis not present

## 2021-07-19 DIAGNOSIS — G4733 Obstructive sleep apnea (adult) (pediatric): Secondary | ICD-10-CM | POA: Diagnosis not present

## 2021-08-13 DIAGNOSIS — E039 Hypothyroidism, unspecified: Secondary | ICD-10-CM | POA: Diagnosis not present

## 2021-08-18 DIAGNOSIS — G4733 Obstructive sleep apnea (adult) (pediatric): Secondary | ICD-10-CM | POA: Diagnosis not present

## 2021-08-21 DIAGNOSIS — E039 Hypothyroidism, unspecified: Secondary | ICD-10-CM | POA: Diagnosis not present

## 2021-08-21 DIAGNOSIS — Z6831 Body mass index (BMI) 31.0-31.9, adult: Secondary | ICD-10-CM | POA: Diagnosis not present

## 2021-08-21 DIAGNOSIS — R030 Elevated blood-pressure reading, without diagnosis of hypertension: Secondary | ICD-10-CM | POA: Diagnosis not present

## 2021-08-21 DIAGNOSIS — E785 Hyperlipidemia, unspecified: Secondary | ICD-10-CM | POA: Diagnosis not present

## 2021-08-23 DIAGNOSIS — G4733 Obstructive sleep apnea (adult) (pediatric): Secondary | ICD-10-CM | POA: Diagnosis not present

## 2021-10-19 DIAGNOSIS — G4733 Obstructive sleep apnea (adult) (pediatric): Secondary | ICD-10-CM | POA: Diagnosis not present

## 2021-12-11 DIAGNOSIS — G4733 Obstructive sleep apnea (adult) (pediatric): Secondary | ICD-10-CM | POA: Diagnosis not present

## 2022-01-01 DIAGNOSIS — L218 Other seborrheic dermatitis: Secondary | ICD-10-CM | POA: Diagnosis not present

## 2022-01-01 DIAGNOSIS — L821 Other seborrheic keratosis: Secondary | ICD-10-CM | POA: Diagnosis not present

## 2022-01-01 DIAGNOSIS — L814 Other melanin hyperpigmentation: Secondary | ICD-10-CM | POA: Diagnosis not present

## 2022-01-01 DIAGNOSIS — D225 Melanocytic nevi of trunk: Secondary | ICD-10-CM | POA: Diagnosis not present

## 2022-01-01 DIAGNOSIS — L570 Actinic keratosis: Secondary | ICD-10-CM | POA: Diagnosis not present

## 2022-01-08 DIAGNOSIS — E039 Hypothyroidism, unspecified: Secondary | ICD-10-CM | POA: Diagnosis not present

## 2022-01-08 DIAGNOSIS — E7800 Pure hypercholesterolemia, unspecified: Secondary | ICD-10-CM | POA: Diagnosis not present

## 2022-01-08 DIAGNOSIS — Z0000 Encounter for general adult medical examination without abnormal findings: Secondary | ICD-10-CM | POA: Diagnosis not present

## 2022-01-08 DIAGNOSIS — Z125 Encounter for screening for malignant neoplasm of prostate: Secondary | ICD-10-CM | POA: Diagnosis not present

## 2022-01-15 DIAGNOSIS — Z800 Family history of malignant neoplasm of digestive organs: Secondary | ICD-10-CM | POA: Diagnosis not present

## 2022-01-15 DIAGNOSIS — Z0000 Encounter for general adult medical examination without abnormal findings: Secondary | ICD-10-CM | POA: Diagnosis not present

## 2022-01-15 DIAGNOSIS — L570 Actinic keratosis: Secondary | ICD-10-CM | POA: Diagnosis not present

## 2022-01-15 DIAGNOSIS — Z23 Encounter for immunization: Secondary | ICD-10-CM | POA: Diagnosis not present

## 2022-01-15 DIAGNOSIS — E039 Hypothyroidism, unspecified: Secondary | ICD-10-CM | POA: Diagnosis not present

## 2022-02-12 DIAGNOSIS — E039 Hypothyroidism, unspecified: Secondary | ICD-10-CM | POA: Diagnosis not present

## 2022-02-21 DIAGNOSIS — E7800 Pure hypercholesterolemia, unspecified: Secondary | ICD-10-CM | POA: Diagnosis not present

## 2022-02-21 DIAGNOSIS — E039 Hypothyroidism, unspecified: Secondary | ICD-10-CM | POA: Diagnosis not present

## 2022-03-26 DIAGNOSIS — H2513 Age-related nuclear cataract, bilateral: Secondary | ICD-10-CM | POA: Diagnosis not present

## 2022-03-26 DIAGNOSIS — D3131 Benign neoplasm of right choroid: Secondary | ICD-10-CM | POA: Diagnosis not present

## 2022-03-26 DIAGNOSIS — H52203 Unspecified astigmatism, bilateral: Secondary | ICD-10-CM | POA: Diagnosis not present

## 2022-04-08 DIAGNOSIS — E039 Hypothyroidism, unspecified: Secondary | ICD-10-CM | POA: Diagnosis not present

## 2022-05-14 DIAGNOSIS — G4733 Obstructive sleep apnea (adult) (pediatric): Secondary | ICD-10-CM | POA: Diagnosis not present

## 2022-06-13 DIAGNOSIS — L918 Other hypertrophic disorders of the skin: Secondary | ICD-10-CM | POA: Diagnosis not present

## 2022-06-13 DIAGNOSIS — L738 Other specified follicular disorders: Secondary | ICD-10-CM | POA: Diagnosis not present

## 2022-06-13 DIAGNOSIS — D225 Melanocytic nevi of trunk: Secondary | ICD-10-CM | POA: Diagnosis not present

## 2022-06-13 DIAGNOSIS — L280 Lichen simplex chronicus: Secondary | ICD-10-CM | POA: Diagnosis not present

## 2022-06-13 DIAGNOSIS — L570 Actinic keratosis: Secondary | ICD-10-CM | POA: Diagnosis not present

## 2022-06-20 IMAGING — DX DG CHEST 2V
2 series · 2 of 2 positions shown · non-contrast
Comparison: None

CLINICAL DATA: Chest pain

EXAM:
CHEST - 2 VIEW

[chest pa]
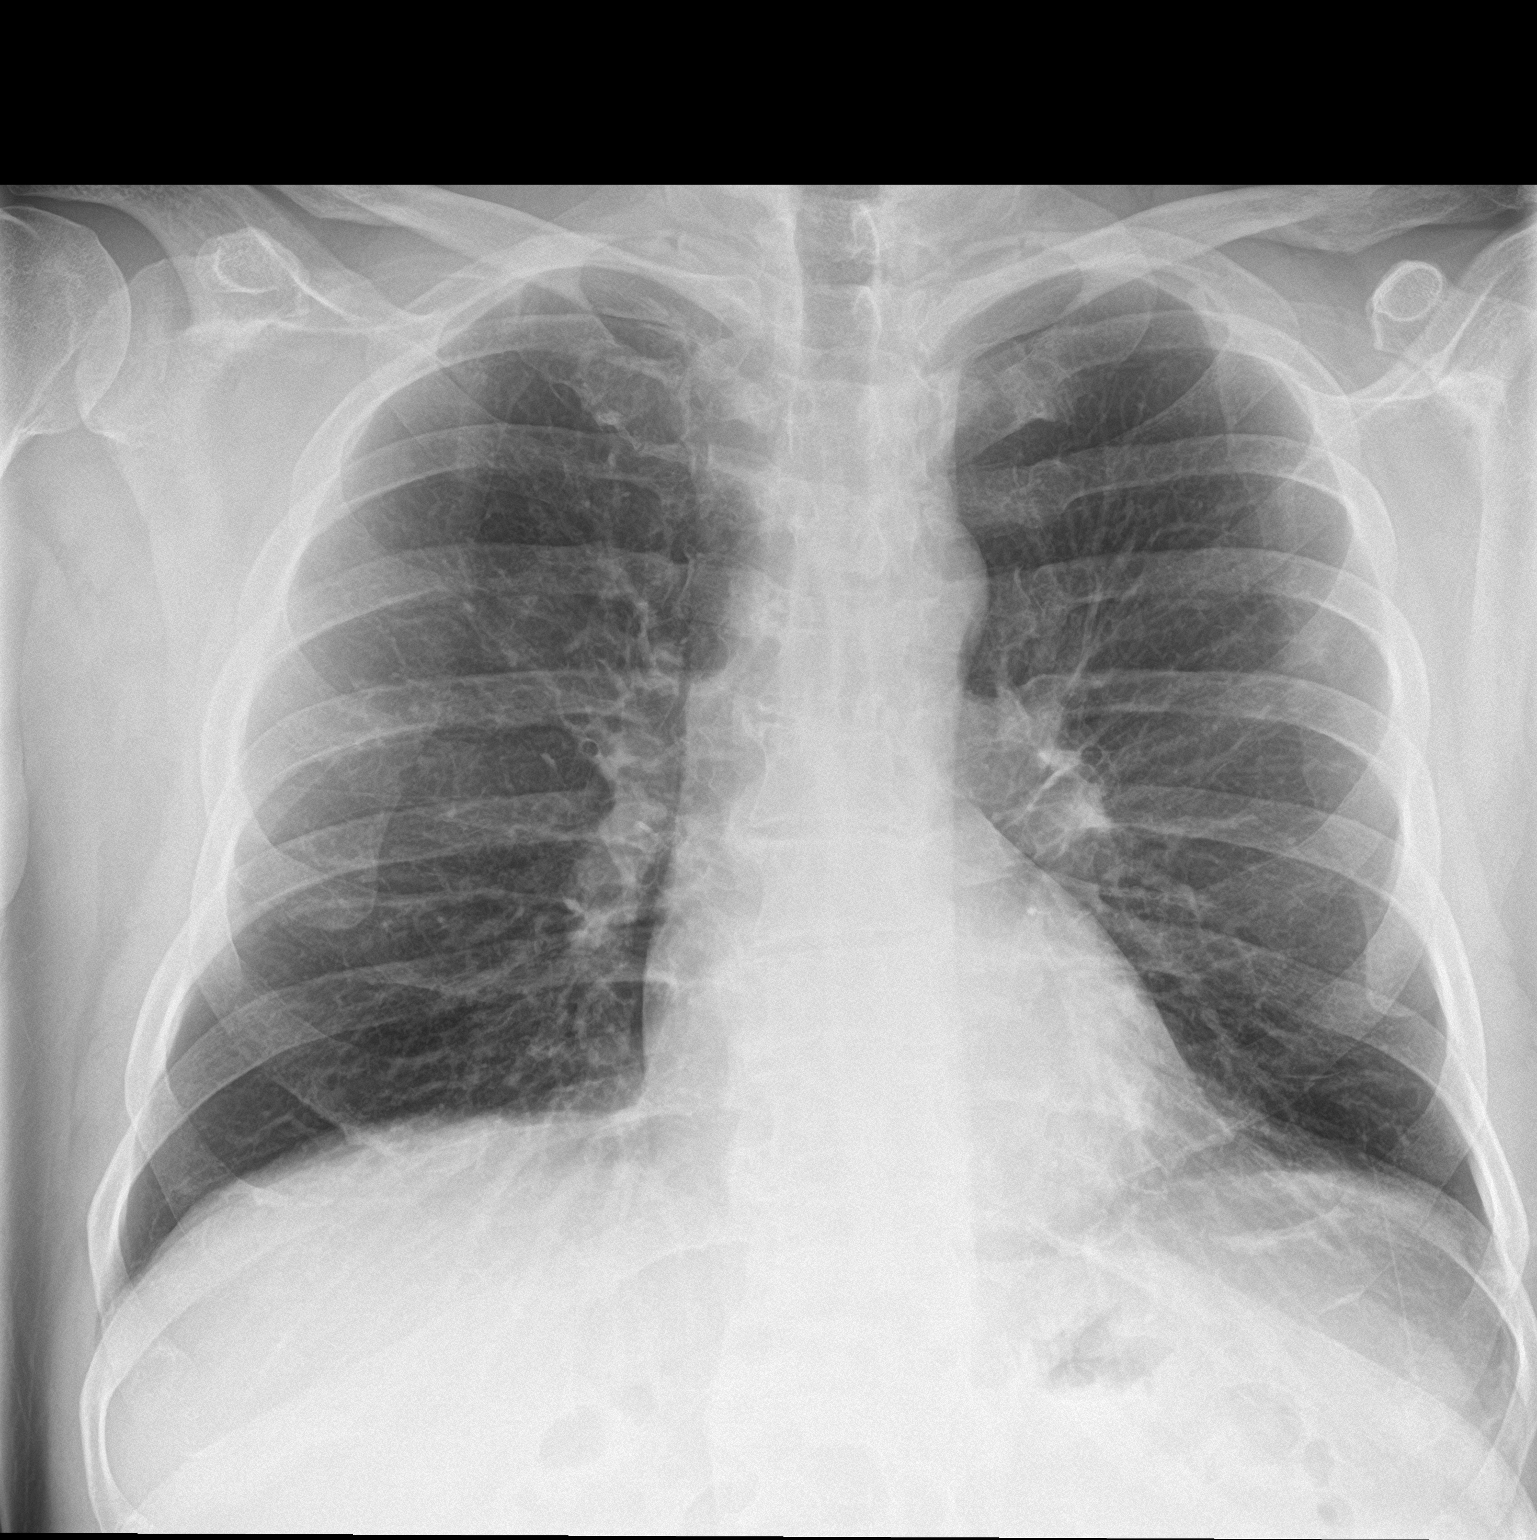

[chest lat]
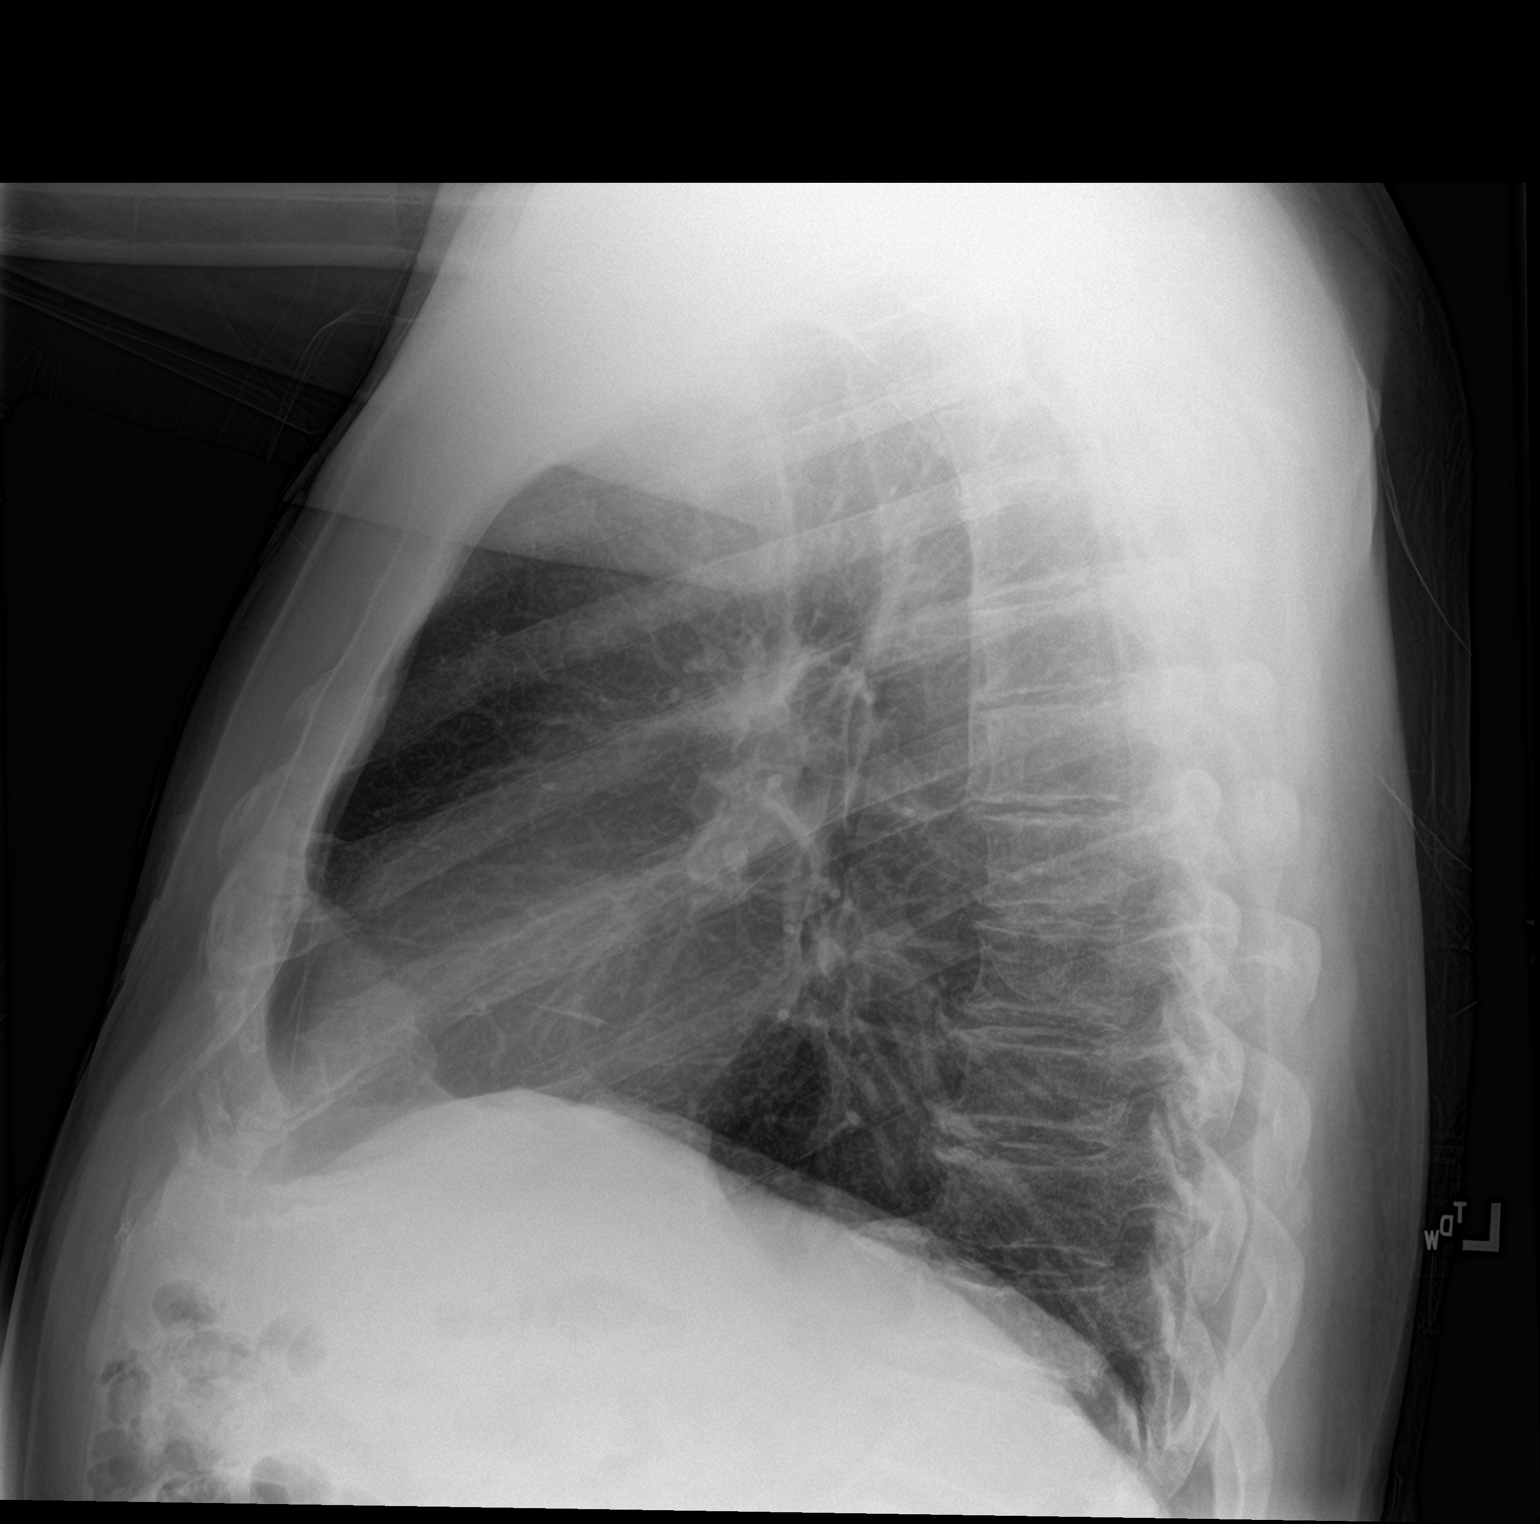

[2 of 2 positions shown; findings below may reference images not displayed]

FINDINGS: Normal heart size, mediastinal contours, and pulmonary vascularity.

Mild peribronchial thickening.

No pulmonary infiltrate, pleural effusion, or pneumothorax.

Bones unremarkable.
IMPRESSION: Bronchitic changes without infiltrate.

## 2022-08-21 DIAGNOSIS — E039 Hypothyroidism, unspecified: Secondary | ICD-10-CM | POA: Diagnosis not present

## 2022-09-03 DIAGNOSIS — E039 Hypothyroidism, unspecified: Secondary | ICD-10-CM | POA: Diagnosis not present

## 2022-09-03 DIAGNOSIS — E7800 Pure hypercholesterolemia, unspecified: Secondary | ICD-10-CM | POA: Diagnosis not present

## 2023-01-07 ENCOUNTER — Other Ambulatory Visit: Payer: Self-pay | Admitting: Registered Nurse

## 2023-01-07 DIAGNOSIS — E039 Hypothyroidism, unspecified: Secondary | ICD-10-CM | POA: Diagnosis not present

## 2023-01-07 DIAGNOSIS — R590 Localized enlarged lymph nodes: Secondary | ICD-10-CM

## 2023-01-07 DIAGNOSIS — M79621 Pain in right upper arm: Secondary | ICD-10-CM | POA: Diagnosis not present

## 2023-01-07 DIAGNOSIS — Z125 Encounter for screening for malignant neoplasm of prostate: Secondary | ICD-10-CM | POA: Diagnosis not present

## 2023-01-07 DIAGNOSIS — Z23 Encounter for immunization: Secondary | ICD-10-CM | POA: Diagnosis not present

## 2023-01-07 DIAGNOSIS — E7800 Pure hypercholesterolemia, unspecified: Secondary | ICD-10-CM | POA: Diagnosis not present

## 2023-01-09 DIAGNOSIS — G4733 Obstructive sleep apnea (adult) (pediatric): Secondary | ICD-10-CM | POA: Diagnosis not present

## 2023-01-22 DIAGNOSIS — Z0000 Encounter for general adult medical examination without abnormal findings: Secondary | ICD-10-CM | POA: Diagnosis not present

## 2023-01-27 DIAGNOSIS — R2231 Localized swelling, mass and lump, right upper limb: Secondary | ICD-10-CM | POA: Diagnosis not present

## 2023-02-06 ENCOUNTER — Other Ambulatory Visit: Payer: No Typology Code available for payment source

## 2023-02-14 ENCOUNTER — Other Ambulatory Visit: Payer: No Typology Code available for payment source

## 2023-04-08 DIAGNOSIS — H2513 Age-related nuclear cataract, bilateral: Secondary | ICD-10-CM | POA: Diagnosis not present

## 2023-04-08 DIAGNOSIS — H5203 Hypermetropia, bilateral: Secondary | ICD-10-CM | POA: Diagnosis not present

## 2023-04-08 DIAGNOSIS — H53001 Unspecified amblyopia, right eye: Secondary | ICD-10-CM | POA: Diagnosis not present

## 2023-05-08 DIAGNOSIS — H2511 Age-related nuclear cataract, right eye: Secondary | ICD-10-CM | POA: Diagnosis not present

## 2023-05-08 DIAGNOSIS — H25811 Combined forms of age-related cataract, right eye: Secondary | ICD-10-CM | POA: Diagnosis not present

## 2023-07-14 DIAGNOSIS — H9201 Otalgia, right ear: Secondary | ICD-10-CM | POA: Diagnosis not present

## 2023-07-14 DIAGNOSIS — H60501 Unspecified acute noninfective otitis externa, right ear: Secondary | ICD-10-CM | POA: Diagnosis not present

## 2023-08-08 DIAGNOSIS — G4733 Obstructive sleep apnea (adult) (pediatric): Secondary | ICD-10-CM | POA: Diagnosis not present

## 2023-08-09 IMAGING — CT CT CARDIAC CORONARY ARTERY CALCIUM SCORE
3 series · 14 of 20 positions shown, 16 images · non-contrast
Comparison: None.

CLINICAL DATA: 60-year-old Caucasian male with history of
hyperlipidemia and family history of heart disease.

EXAM:
CT CARDIAC CORONARY ARTERY CALCIUM SCORE
TECHNIQUE: Non-contrast imaging through the heart was performed using
prospective ECG gating. Image post processing was performed on an
independent workstation, allowing for quantitative analysis of the
heart and coronary arteries. Note that this exam targets the heart
and the chest was not imaged in its entirety.

[Series 2: calcium scoring 2.00 qr36 bestdiast 71% hrt calciu · axial · 0.50mm/px · z∈[+1597,+1693]mm · 4 of 80 slices shown]
[im 16/80  vessel]
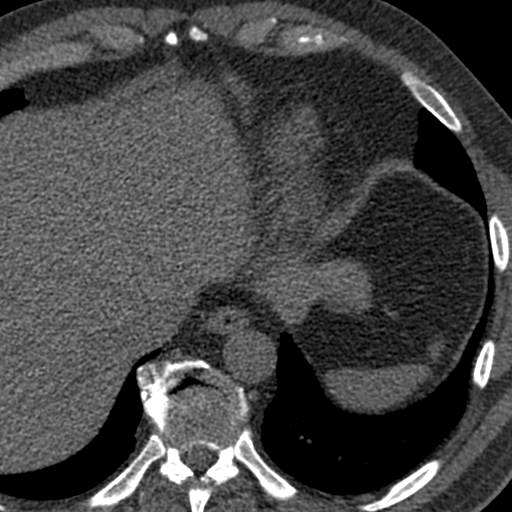
[im 32/80  vessel]
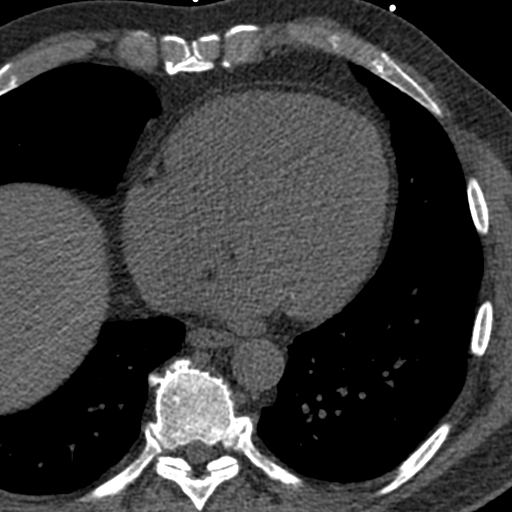
[im 48/80  vessel]
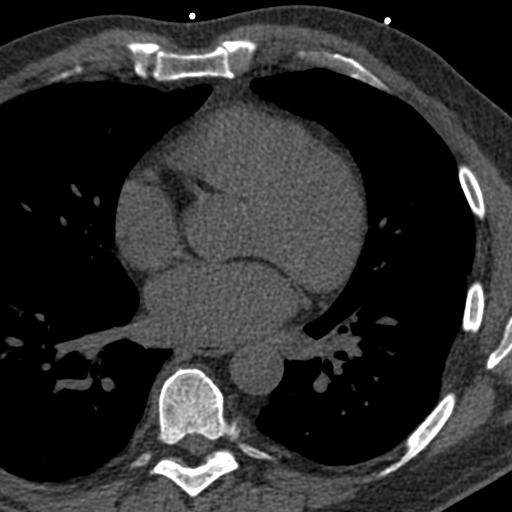
[im 64/80  vessel]
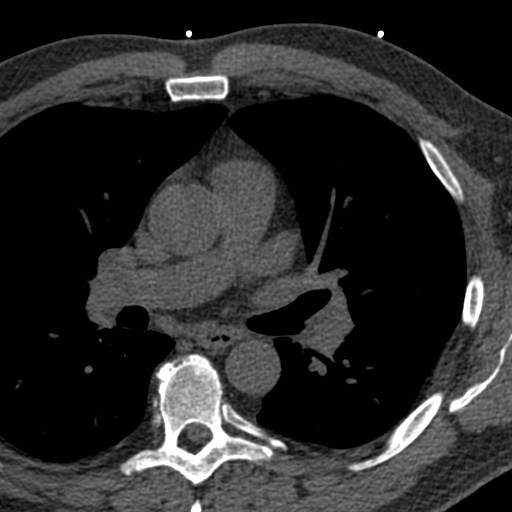

[Series 3: calcium scoring 2.00 br40 bestdiast 71% axial · axial · 0.64mm/px · z∈[+1593,+1697]mm · 5 of 80 slices shown, 7 images]
[im 14/80  vessel]
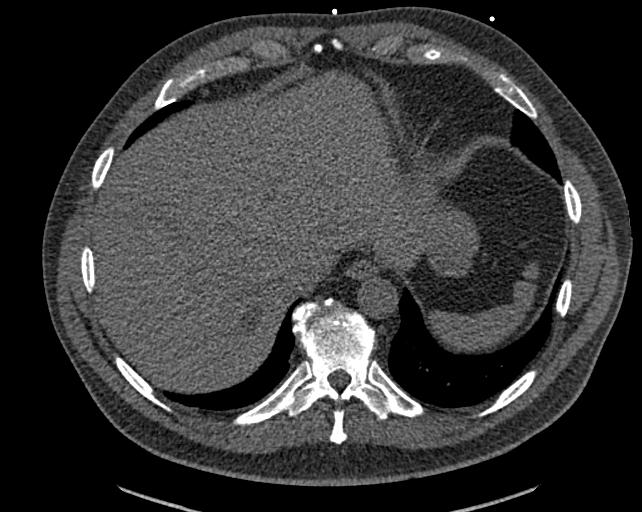
[im 14/80  lung]
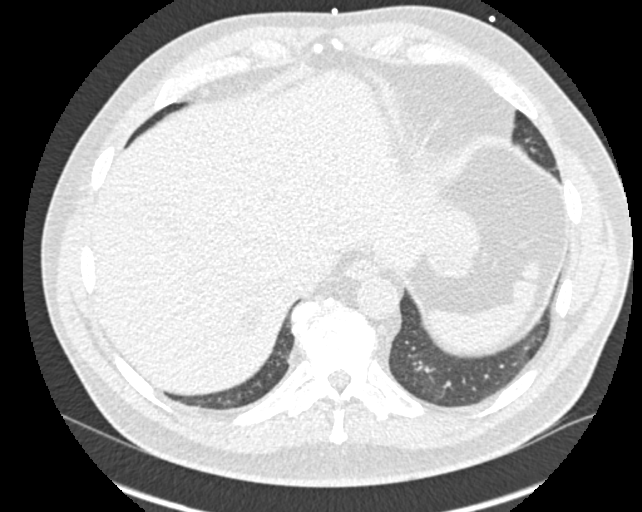
[im 27/80  vessel]
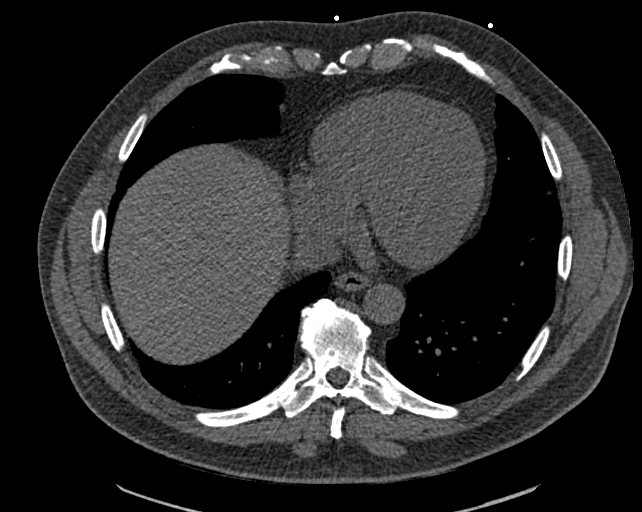
[im 40/80  vessel]
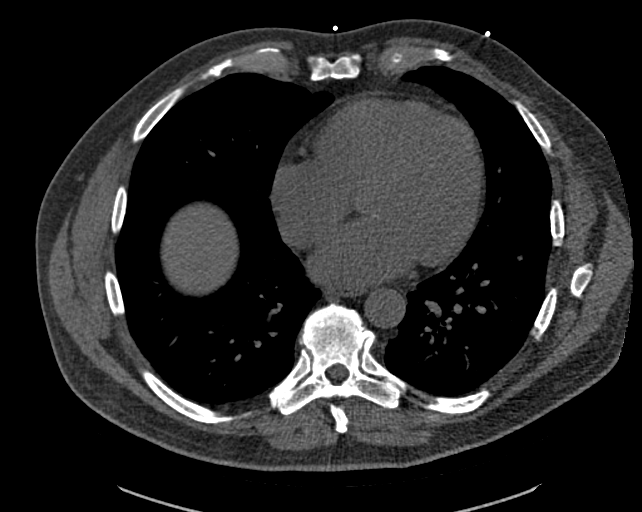
[im 53/80  vessel]
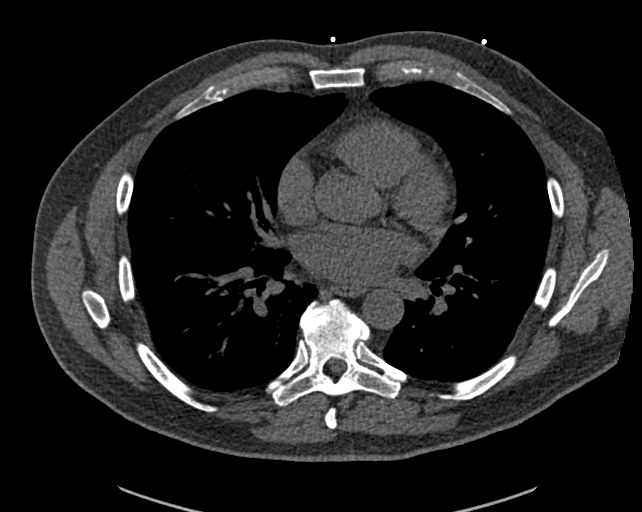
[im 66/80  vessel]
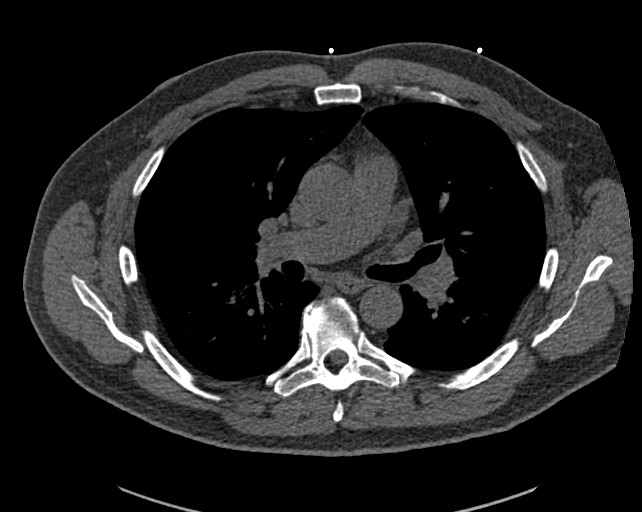
[im 66/80  lung]
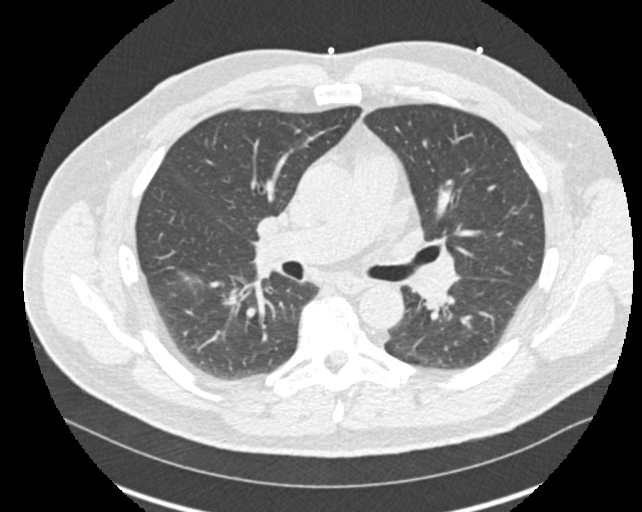

[Series 9: calcium scoring 2.00 br60 bestdiast 71% lungs · axial · 0.64mm/px · z∈[+1593,+1697]mm · 5 of 80 slices shown]
[im 14/80  vessel]
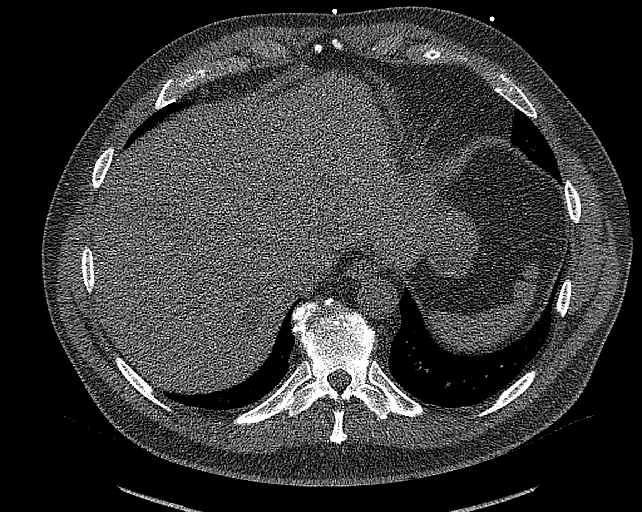
[im 27/80  vessel]
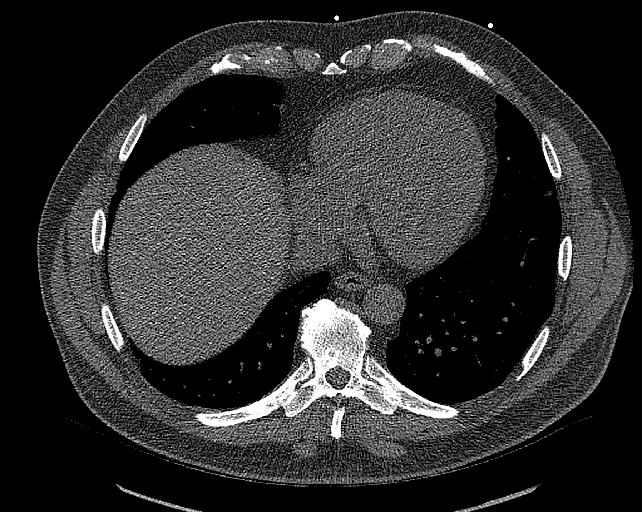
[im 40/80  vessel]
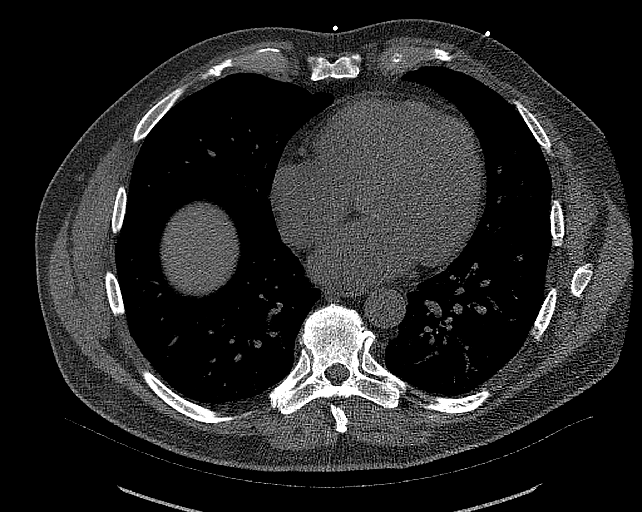
[im 53/80  vessel]
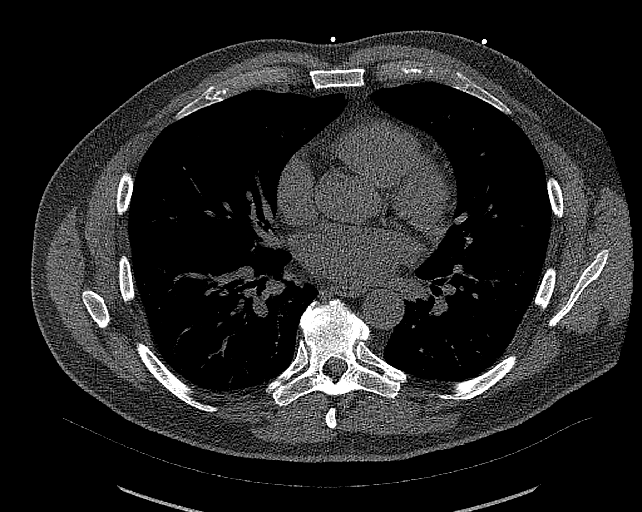
[im 66/80  vessel]
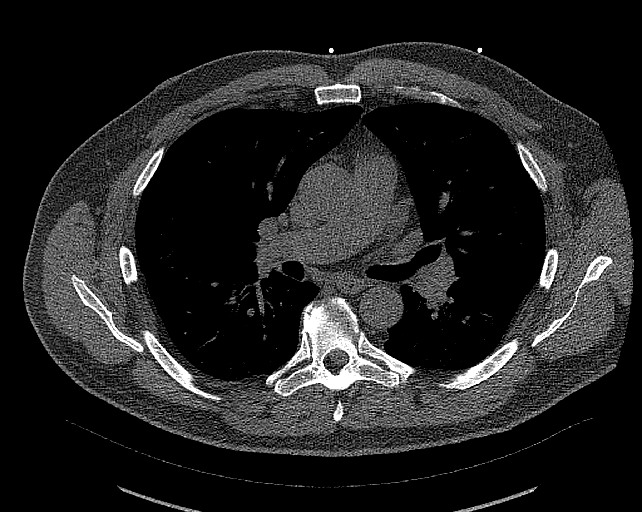

[14 of 20 positions shown; findings below may reference images not displayed]

FINDINGS: CORONARY CALCIUM SCORES:

Left Main: 0

LAD: 0

LCx: 0

RCA: 0

Total Agatston Score: 0

[HOSPITAL] percentile: 0

AORTA MEASUREMENTS:

Ascending Aorta: 34 mm

Descending Aorta: 26 mm

OTHER FINDINGS:

The heart size is within normal limits. No pericardial fluid is
identified. Visualized segments of the thoracic aorta and central
pulmonary arteries are normal in caliber. Visualized mediastinum and
hilar regions demonstrate no lymphadenopathy or masses. Visualized
lungs show no evidence of pulmonary edema, consolidation,
pneumothorax, nodule or pleural fluid. Cyst in the posterior right
lobe of the liver has a benign appearance and measures roughly
cm. Visualized bony structures are unremarkable.
IMPRESSION: Coronary calcium score of 0.

## 2023-08-29 DIAGNOSIS — H53001 Unspecified amblyopia, right eye: Secondary | ICD-10-CM | POA: Diagnosis not present

## 2023-08-29 DIAGNOSIS — H2512 Age-related nuclear cataract, left eye: Secondary | ICD-10-CM | POA: Diagnosis not present

## 2023-09-03 DIAGNOSIS — E039 Hypothyroidism, unspecified: Secondary | ICD-10-CM | POA: Diagnosis not present

## 2023-09-12 DIAGNOSIS — E039 Hypothyroidism, unspecified: Secondary | ICD-10-CM | POA: Diagnosis not present

## 2023-11-17 IMAGING — US US THYROID
1 series · 13 of 25 positions shown · non-contrast
Comparison: None.

CLINICAL DATA: Goiter.

EXAM:
THYROID ULTRASOUND
TECHNIQUE: Ultrasound examination of the thyroid gland and adjacent soft
tissues was performed.

[Series 1: us thyroid · 0.05mm/px · 13 of 58 slices shown]
[im 1/58]
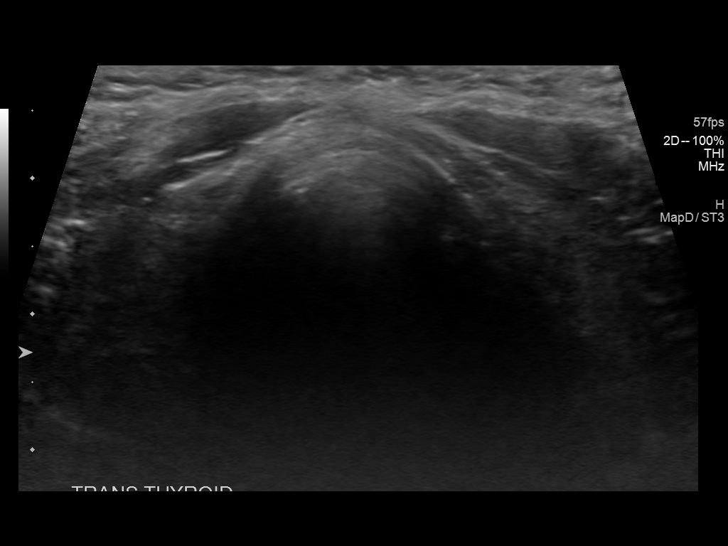
[im 5/58]
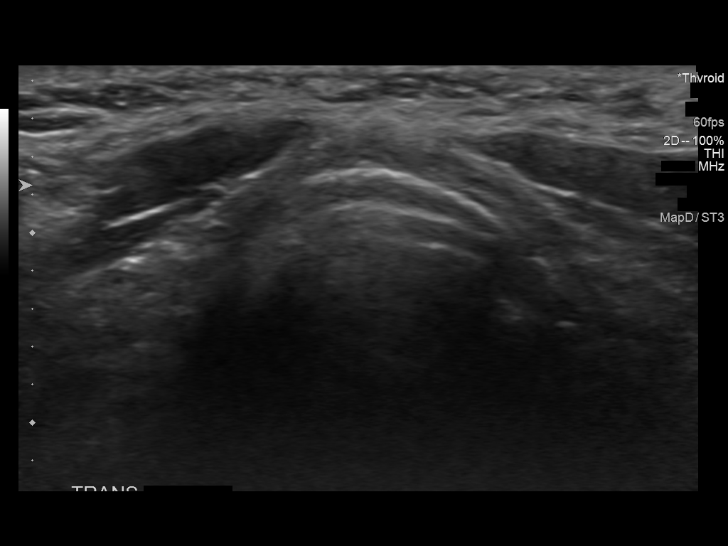
[im 10/58]
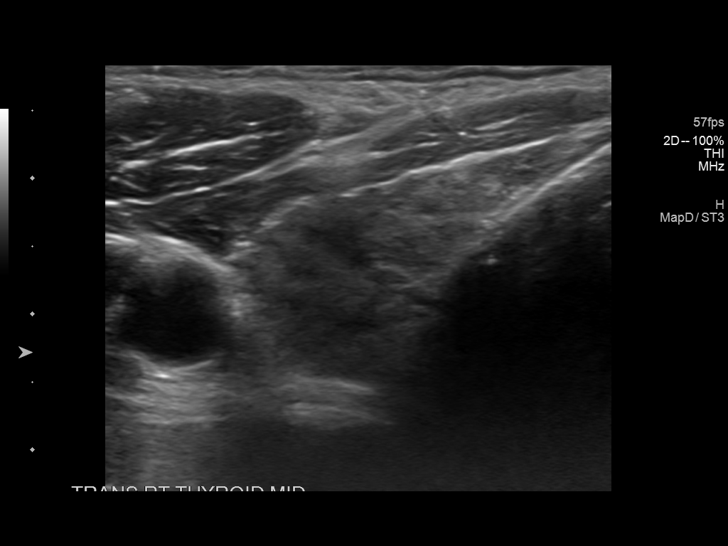
[im 15/58]
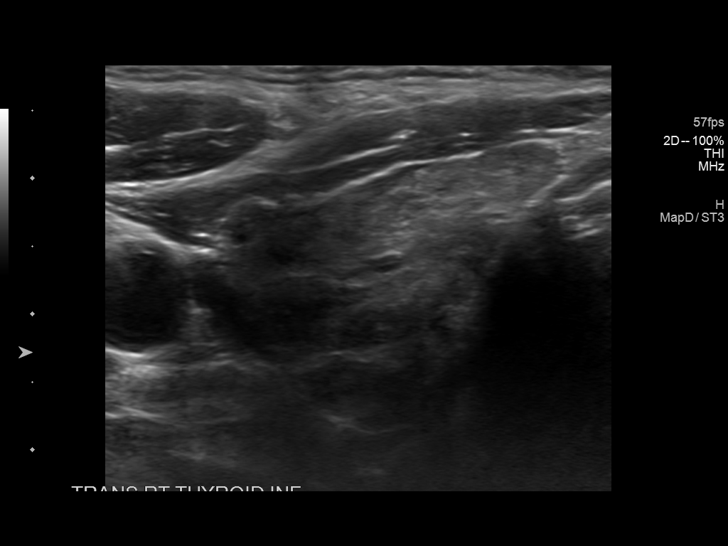
[im 20/58]
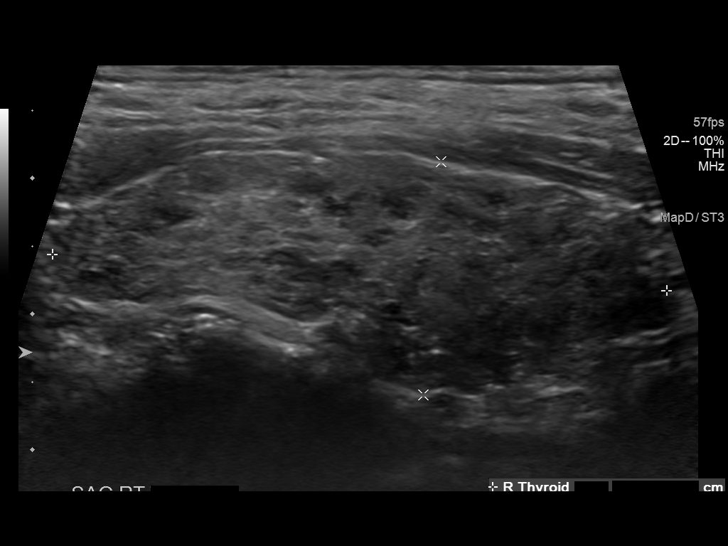
[im 24/58]
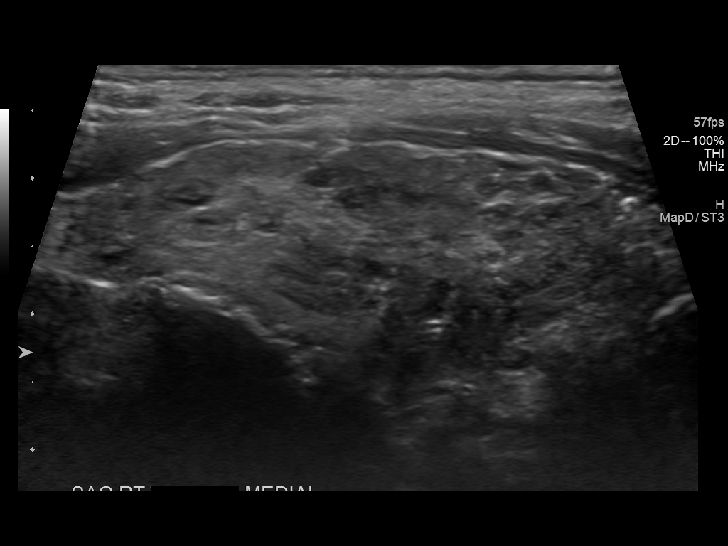
[im 29/58]
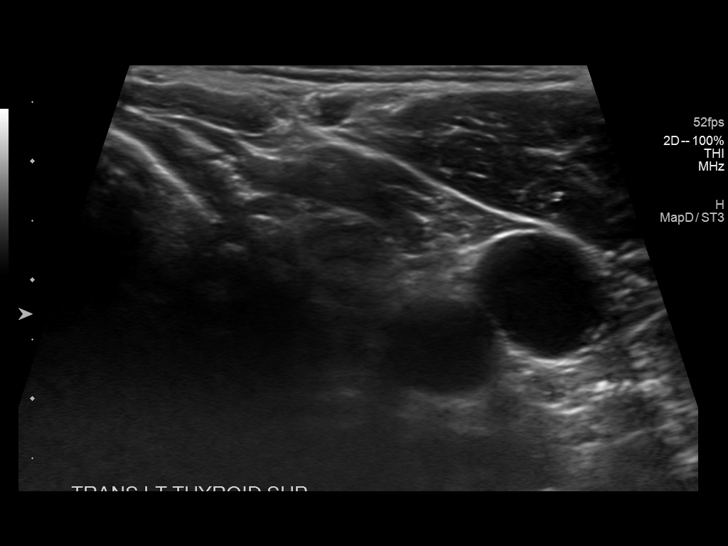
[im 34/58]
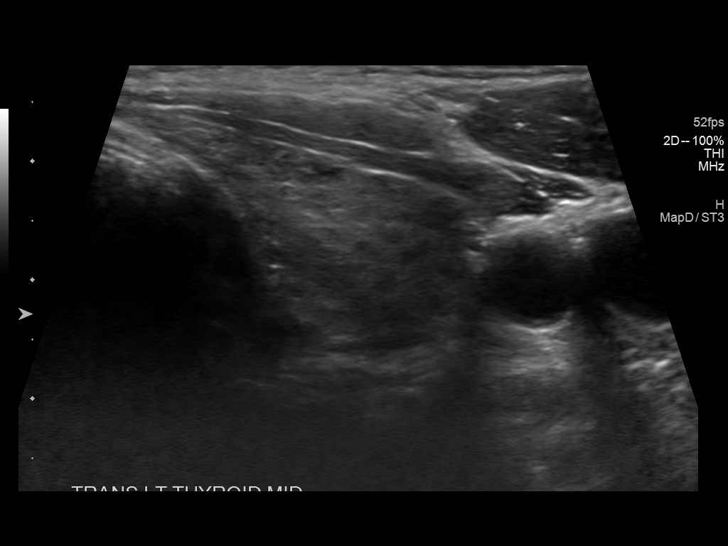
[im 39/58]
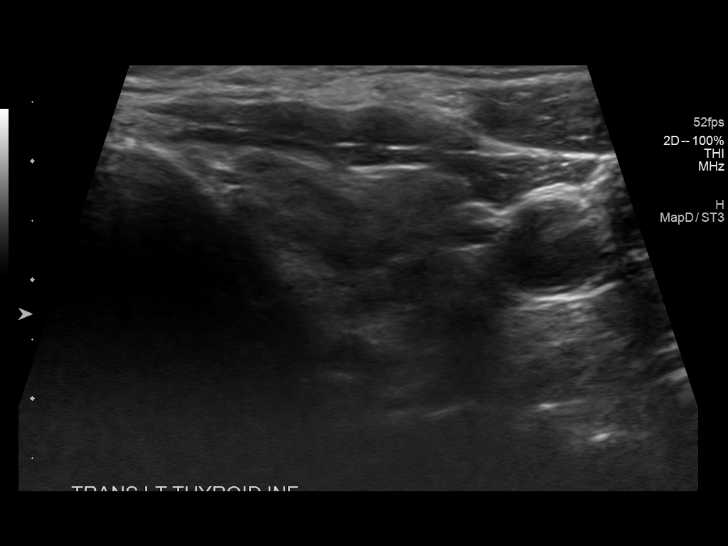
[im 43/58]
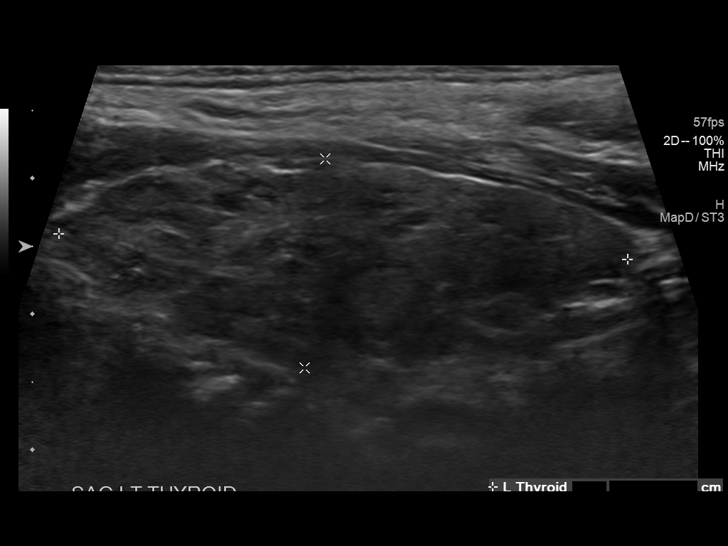
[im 48/58]
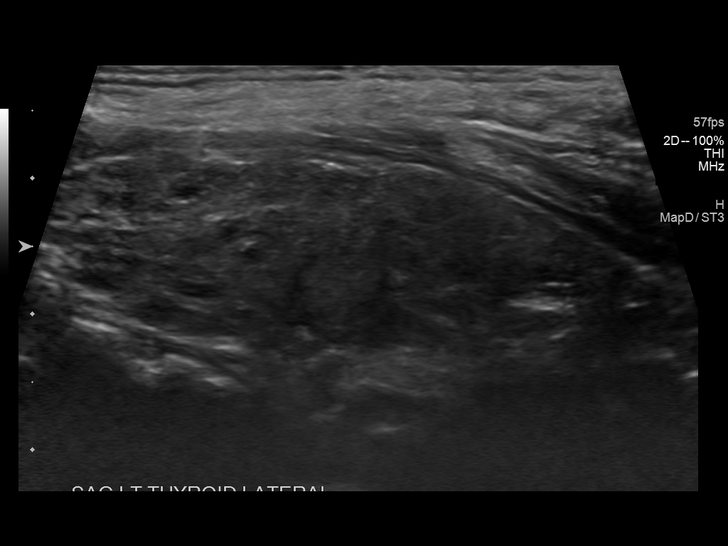
[im 53/58]
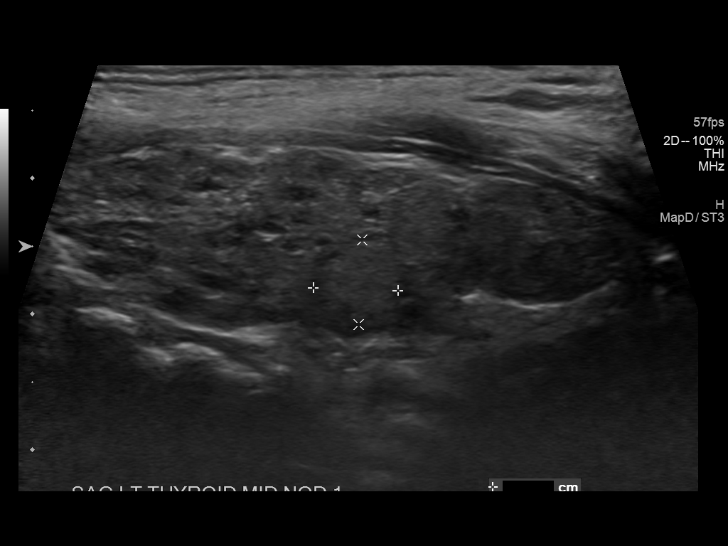
[im 58/58]
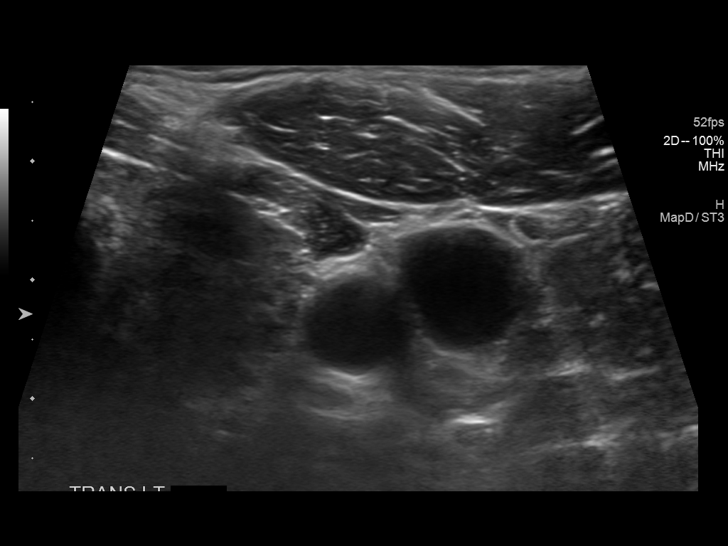

[13 of 25 positions shown; findings below may reference images not displayed]

FINDINGS: Parenchymal Echotexture: Moderately heterogenous, no significant
increased vascularity

Isthmus: 0.2 cm

Right lobe: 4.5 x 1.7 x 1.8 cm

Left lobe: 4.2 x 1.6 x 1.8 cm

_________________________________________________________

Estimated total number of nodules >/= 1 cm: 1

Number of spongiform nodules >/=  2 cm not described below (TR1): 0

Number of mixed cystic and solid nodules >/= 1.5 cm not described
below (TR2): 0

_________________________________________________________

Nodule # 1:

Location: Left; Mid

Maximum size: 0.6 cm; Other 2 dimensions: 0.6 x 0.6 cm

Composition: solid/almost completely solid (2)

Echogenicity: isoechoic (1)

Shape: not taller-than-wide (0)

Margins: ill-defined (0)

Echogenic foci: none (0)

ACR TI-RADS total points: 3.

ACR TI-RADS risk category: TR3 (3 points).

ACR TI-RADS recommendations:

Given size (<1.4 cm) and appearance, this nodule does NOT meet
TI-RADS criteria for biopsy or dedicated follow-up.

_________________________________________________________

No cervical lymphadenopathy.
IMPRESSION: Moderately heterogeneous, normal-sized thyroid gland without
significant internal vascularity as could be seen with chronic
thyroiditis or autoimmune thyroid disorder.

The above is in keeping with the ACR TI-RADS recommendations - [HOSPITAL] 7378;[DATE].

## 2023-12-02 DIAGNOSIS — Z23 Encounter for immunization: Secondary | ICD-10-CM | POA: Diagnosis not present

## 2023-12-02 DIAGNOSIS — M25562 Pain in left knee: Secondary | ICD-10-CM | POA: Diagnosis not present

## 2023-12-02 DIAGNOSIS — R600 Localized edema: Secondary | ICD-10-CM | POA: Diagnosis not present

## 2023-12-02 DIAGNOSIS — R1084 Generalized abdominal pain: Secondary | ICD-10-CM | POA: Diagnosis not present

## 2023-12-04 DIAGNOSIS — R600 Localized edema: Secondary | ICD-10-CM | POA: Diagnosis not present

## 2023-12-04 DIAGNOSIS — I87393 Chronic venous hypertension (idiopathic) with other complications of bilateral lower extremity: Secondary | ICD-10-CM | POA: Diagnosis not present

## 2023-12-04 DIAGNOSIS — I83892 Varicose veins of left lower extremities with other complications: Secondary | ICD-10-CM | POA: Diagnosis not present

## 2023-12-04 DIAGNOSIS — G2581 Restless legs syndrome: Secondary | ICD-10-CM | POA: Diagnosis not present

## 2023-12-04 DIAGNOSIS — M79662 Pain in left lower leg: Secondary | ICD-10-CM | POA: Diagnosis not present

## 2023-12-04 DIAGNOSIS — I872 Venous insufficiency (chronic) (peripheral): Secondary | ICD-10-CM | POA: Diagnosis not present

## 2023-12-11 NOTE — Progress Notes (Unsigned)
   LILLETTE Ileana Collet, PhD, LAT, ATC acting as a scribe for Artist Lloyd, MD.  Jay Patton is a 63 y.o. male who presents to [facility] at [facility] today for L knee pain x ***. Pt locates pain to ***  L Knee swelling: Mechanical symptoms: Aggravates: Treatments tried:  Pertinent review of systems: ***  Relevant historical information: ***   Exam:  There were no vitals taken for this visit. General: Well Developed, well nourished, and in no acute distress.   MSK: ***    Lab and Radiology Results No results found for this or any previous visit (from the past 72 hours). No results found.     Assessment and Plan: 63 y.o. male with ***   PDMP not reviewed this encounter. No orders of the defined types were placed in this encounter.  No orders of the defined types were placed in this encounter.    Discussed warning signs or symptoms. Please see discharge instructions. Patient expresses understanding.   ***

## 2023-12-12 ENCOUNTER — Encounter: Payer: Self-pay | Admitting: Family Medicine

## 2023-12-12 ENCOUNTER — Other Ambulatory Visit: Payer: Self-pay

## 2023-12-12 ENCOUNTER — Ambulatory Visit: Admitting: Family Medicine

## 2023-12-12 VITALS — BP 146/78 | HR 70 | Ht 73.0 in | Wt 229.0 lb

## 2023-12-12 DIAGNOSIS — G8929 Other chronic pain: Secondary | ICD-10-CM

## 2023-12-12 DIAGNOSIS — M7122 Synovial cyst of popliteal space [Baker], left knee: Secondary | ICD-10-CM | POA: Diagnosis not present

## 2023-12-12 DIAGNOSIS — M25562 Pain in left knee: Secondary | ICD-10-CM | POA: Diagnosis not present

## 2023-12-12 NOTE — Patient Instructions (Signed)
 Thank you for coming in today.   Please use Voltaren gel (Generic Diclofenac Gel) up to 4x daily for pain as needed.  This is available over-the-counter as both the name brand Voltaren gel and the generic diclofenac gel.   Try using a compression sleeve  Check back as needed

## 2024-01-21 DIAGNOSIS — E291 Testicular hypofunction: Secondary | ICD-10-CM | POA: Diagnosis not present

## 2024-01-21 DIAGNOSIS — E7800 Pure hypercholesterolemia, unspecified: Secondary | ICD-10-CM | POA: Diagnosis not present

## 2024-01-27 DIAGNOSIS — Z800 Family history of malignant neoplasm of digestive organs: Secondary | ICD-10-CM | POA: Diagnosis not present

## 2024-01-27 DIAGNOSIS — E7800 Pure hypercholesterolemia, unspecified: Secondary | ICD-10-CM | POA: Diagnosis not present

## 2024-01-27 DIAGNOSIS — Z0000 Encounter for general adult medical examination without abnormal findings: Secondary | ICD-10-CM | POA: Diagnosis not present
